# Patient Record
Sex: Female | Born: 1960
Health system: Southern US, Community
[De-identification: ages and names within clinical notes are randomized; demographics above are authoritative.]

## PROBLEM LIST (undated history)

## (undated) DIAGNOSIS — K219 Gastro-esophageal reflux disease without esophagitis: Secondary | ICD-10-CM

---

## 1998-04-05 ENCOUNTER — Other Ambulatory Visit: Admission: RE | Admit: 1998-04-05 | Discharge: 1998-04-05 | Payer: Self-pay | Admitting: Internal Medicine

## 1999-06-13 ENCOUNTER — Encounter: Admission: RE | Admit: 1999-06-13 | Discharge: 1999-06-13 | Payer: Self-pay | Admitting: Obstetrics

## 1999-09-10 ENCOUNTER — Encounter: Admission: RE | Admit: 1999-09-10 | Discharge: 1999-09-10 | Payer: Self-pay | Admitting: Obstetrics & Gynecology

## 1999-09-10 ENCOUNTER — Other Ambulatory Visit: Admission: RE | Admit: 1999-09-10 | Discharge: 1999-09-10 | Payer: Self-pay | Admitting: Obstetrics & Gynecology

## 1999-10-11 ENCOUNTER — Encounter: Admission: RE | Admit: 1999-10-11 | Discharge: 1999-10-11 | Payer: Self-pay | Admitting: Obstetrics & Gynecology

## 2000-04-21 ENCOUNTER — Encounter: Admission: RE | Admit: 2000-04-21 | Discharge: 2000-04-21 | Payer: Self-pay | Admitting: Internal Medicine

## 2000-04-21 ENCOUNTER — Encounter: Payer: Self-pay | Admitting: Internal Medicine

## 2000-04-24 ENCOUNTER — Encounter: Payer: Self-pay | Admitting: Internal Medicine

## 2000-04-24 ENCOUNTER — Encounter: Admission: RE | Admit: 2000-04-24 | Discharge: 2000-04-24 | Payer: Self-pay | Admitting: Internal Medicine

## 2001-08-24 ENCOUNTER — Other Ambulatory Visit: Admission: RE | Admit: 2001-08-24 | Discharge: 2001-08-24 | Payer: Self-pay | Admitting: Internal Medicine

## 2003-02-15 ENCOUNTER — Emergency Department (HOSPITAL_COMMUNITY): Admission: EM | Admit: 2003-02-15 | Discharge: 2003-02-15 | Payer: Self-pay | Admitting: Emergency Medicine

## 2003-02-15 ENCOUNTER — Encounter: Payer: Self-pay | Admitting: Emergency Medicine

## 2003-10-31 ENCOUNTER — Encounter: Admission: RE | Admit: 2003-10-31 | Discharge: 2003-10-31 | Payer: Self-pay | Admitting: Internal Medicine

## 2004-11-13 ENCOUNTER — Encounter: Admission: RE | Admit: 2004-11-13 | Discharge: 2004-11-13 | Payer: Self-pay | Admitting: Internal Medicine

## 2005-12-04 ENCOUNTER — Encounter: Admission: RE | Admit: 2005-12-04 | Discharge: 2005-12-04 | Payer: Self-pay | Admitting: Internal Medicine

## 2007-12-06 ENCOUNTER — Ambulatory Visit: Payer: Self-pay | Admitting: Internal Medicine

## 2007-12-06 DIAGNOSIS — K219 Gastro-esophageal reflux disease without esophagitis: Secondary | ICD-10-CM | POA: Insufficient documentation

## 2007-12-06 DIAGNOSIS — J309 Allergic rhinitis, unspecified: Secondary | ICD-10-CM | POA: Insufficient documentation

## 2008-01-24 ENCOUNTER — Encounter: Admission: RE | Admit: 2008-01-24 | Discharge: 2008-01-24 | Payer: Self-pay | Admitting: Internal Medicine

## 2009-01-29 ENCOUNTER — Encounter: Admission: RE | Admit: 2009-01-29 | Discharge: 2009-01-29 | Payer: Self-pay | Admitting: Internal Medicine

## 2010-01-30 ENCOUNTER — Encounter: Admission: RE | Admit: 2010-01-30 | Discharge: 2010-01-30 | Payer: Self-pay | Admitting: Internal Medicine

## 2011-04-23 ENCOUNTER — Other Ambulatory Visit: Payer: Self-pay | Admitting: Internal Medicine

## 2011-04-23 DIAGNOSIS — Z1231 Encounter for screening mammogram for malignant neoplasm of breast: Secondary | ICD-10-CM

## 2011-06-16 ENCOUNTER — Ambulatory Visit
Admission: RE | Admit: 2011-06-16 | Discharge: 2011-06-16 | Disposition: A | Discharge status: Home / Self Care | Payer: 59 | Source: Ambulatory Visit | Attending: Internal Medicine | Admitting: Internal Medicine

## 2011-06-16 DIAGNOSIS — Z1231 Encounter for screening mammogram for malignant neoplasm of breast: Secondary | ICD-10-CM

## 2011-07-18 ENCOUNTER — Emergency Department (HOSPITAL_COMMUNITY)
Admission: EM | Admit: 2011-07-18 | Discharge: 2011-07-18 | Disposition: A | Discharge status: Home / Self Care | Payer: No Typology Code available for payment source | Attending: Emergency Medicine | Admitting: Emergency Medicine

## 2011-07-18 ENCOUNTER — Encounter: Payer: Self-pay | Admitting: *Deleted

## 2011-07-18 DIAGNOSIS — T1490XA Injury, unspecified, initial encounter: Secondary | ICD-10-CM | POA: Insufficient documentation

## 2011-07-18 DIAGNOSIS — M542 Cervicalgia: Secondary | ICD-10-CM | POA: Insufficient documentation

## 2011-07-18 DIAGNOSIS — R51 Headache: Secondary | ICD-10-CM | POA: Insufficient documentation

## 2011-07-18 HISTORY — DX: Gastro-esophageal reflux disease without esophagitis: K21.9

## 2011-07-18 MED ORDER — IBUPROFEN 800 MG PO TABS: 800.0000 mg | ORAL_TABLET | Freq: Three times a day (TID) | ORAL | Status: AC

## 2011-07-18 MED ORDER — CYCLOBENZAPRINE HCL 10 MG PO TABS: 10.0000 mg | ORAL_TABLET | Freq: Three times a day (TID) | ORAL | Status: AC | PRN

## 2011-07-18 MED ORDER — CYCLOBENZAPRINE HCL 10 MG PO TABS
10.0000 mg | ORAL_TABLET | Freq: Once | ORAL | Status: AC
  Administered 2011-07-18: 10 mg via ORAL
  Filled 2011-07-18: qty 1

## 2011-07-18 MED ORDER — IBUPROFEN 800 MG PO TABS
800.0000 mg | ORAL_TABLET | Freq: Once | ORAL | Status: AC
  Administered 2011-07-18: 800 mg via ORAL
  Filled 2011-07-18: qty 1

## 2011-07-18 NOTE — ED Notes (Signed)
Rx given to pt. 
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
## 2011-07-20 NOTE — ED Provider Notes (Signed)
Medical screening examination/treatment/procedure(s) were performed by non-physician practitioner and as supervising physician I was immediately available for consultation/collaboration.  Cyndra Numbers, MD 07/20/11 [phone]

## 2011-07-24 ENCOUNTER — Ambulatory Visit
Admission: RE | Admit: 2011-07-24 | Discharge: 2011-07-24 | Disposition: A | Discharge status: Home / Self Care | Payer: 59 | Source: Ambulatory Visit | Attending: Family Medicine | Admitting: Family Medicine

## 2011-07-24 ENCOUNTER — Other Ambulatory Visit: Payer: Self-pay | Admitting: Family Medicine

## 2011-07-24 ENCOUNTER — Other Ambulatory Visit (HOSPITAL_COMMUNITY): Payer: Self-pay | Admitting: Family Medicine

## 2011-07-24 DIAGNOSIS — R4781 Slurred speech: Secondary | ICD-10-CM

## 2011-07-24 DIAGNOSIS — R51 Headache: Secondary | ICD-10-CM

## 2011-07-24 DIAGNOSIS — R42 Dizziness and giddiness: Secondary | ICD-10-CM

## 2011-07-25 ENCOUNTER — Other Ambulatory Visit (HOSPITAL_COMMUNITY): Payer: 59

## 2011-07-25 ENCOUNTER — Ambulatory Visit (HOSPITAL_COMMUNITY)
Admission: RE | Admit: 2011-07-25 | Discharge: 2011-07-25 | Disposition: A | Discharge status: Home / Self Care | Payer: No Typology Code available for payment source | Source: Ambulatory Visit | Attending: Family Medicine | Admitting: Family Medicine

## 2011-07-25 DIAGNOSIS — R4789 Other speech disturbances: Secondary | ICD-10-CM | POA: Insufficient documentation

## 2011-07-25 DIAGNOSIS — R42 Dizziness and giddiness: Secondary | ICD-10-CM | POA: Insufficient documentation

## 2011-07-25 DIAGNOSIS — R4781 Slurred speech: Secondary | ICD-10-CM

## 2011-07-25 DIAGNOSIS — R51 Headache: Secondary | ICD-10-CM

## 2012-06-09 ENCOUNTER — Other Ambulatory Visit: Payer: Self-pay | Admitting: Internal Medicine

## 2012-06-09 DIAGNOSIS — Z1231 Encounter for screening mammogram for malignant neoplasm of breast: Secondary | ICD-10-CM

## 2012-07-19 ENCOUNTER — Ambulatory Visit
Admission: RE | Admit: 2012-07-19 | Discharge: 2012-07-19 | Disposition: A | Discharge status: Home / Self Care | Payer: 59 | Source: Ambulatory Visit | Attending: Internal Medicine | Admitting: Internal Medicine

## 2012-07-19 DIAGNOSIS — Z1231 Encounter for screening mammogram for malignant neoplasm of breast: Secondary | ICD-10-CM

## 2012-10-27 ENCOUNTER — Other Ambulatory Visit: Payer: Self-pay | Admitting: Internal Medicine

## 2012-10-27 DIAGNOSIS — R05 Cough: Secondary | ICD-10-CM

## 2012-10-27 DIAGNOSIS — R059 Cough, unspecified: Secondary | ICD-10-CM

## 2012-10-27 MED ORDER — PHENYLEPH-PROMETHAZINE-COD 5-6.25-10 MG/5ML PO SYRP: 5.0000 mL | ORAL_SOLUTION | Freq: Three times a day (TID) | ORAL | Status: DC | PRN

## 2013-05-05 ENCOUNTER — Other Ambulatory Visit: Payer: Self-pay | Admitting: Internal Medicine

## 2013-05-05 DIAGNOSIS — R05 Cough: Secondary | ICD-10-CM

## 2013-05-05 DIAGNOSIS — R059 Cough, unspecified: Secondary | ICD-10-CM

## 2013-05-05 MED ORDER — PHENYLEPH-PROMETHAZINE-COD 5-6.25-10 MG/5ML PO SYRP: 5.0000 mL | ORAL_SOLUTION | Freq: Three times a day (TID) | ORAL | Status: DC | PRN

## 2013-07-04 ENCOUNTER — Other Ambulatory Visit: Payer: Self-pay

## 2013-07-04 DIAGNOSIS — Z1231 Encounter for screening mammogram for malignant neoplasm of breast: Secondary | ICD-10-CM

## 2013-07-19 ENCOUNTER — Other Ambulatory Visit (INDEPENDENT_AMBULATORY_CARE_PROVIDER_SITE_OTHER): Payer: 59

## 2013-07-19 ENCOUNTER — Ambulatory Visit (INDEPENDENT_AMBULATORY_CARE_PROVIDER_SITE_OTHER): Payer: 59 | Admitting: Family Medicine

## 2013-07-19 ENCOUNTER — Encounter: Payer: Self-pay | Admitting: Family Medicine

## 2013-07-19 VITALS — BP 132/86 | HR 80 | Ht 65.0 in

## 2013-07-19 DIAGNOSIS — M25579 Pain in unspecified ankle and joints of unspecified foot: Secondary | ICD-10-CM

## 2013-07-19 DIAGNOSIS — M6749 Ganglion, multiple sites: Secondary | ICD-10-CM

## 2013-07-19 DIAGNOSIS — M25571 Pain in right ankle and joints of right foot: Secondary | ICD-10-CM

## 2013-07-19 DIAGNOSIS — M71371 Other bursal cyst, right ankle and foot: Secondary | ICD-10-CM | POA: Insufficient documentation

## 2013-07-19 MED ORDER — MELOXICAM 15 MG PO TABS: 15.0000 mg | ORAL_TABLET | Freq: Every day | ORAL | Status: DC

## 2013-07-19 MED ORDER — TRAMADOL HCL 50 MG PO TABS: 50.0000 mg | ORAL_TABLET | Freq: Every evening | ORAL | Status: DC | PRN

## 2013-07-19 NOTE — Assessment & Plan Note (Signed)
Patient has what appears to be a ganglion cyst on the lateral aspect. The cyst wall was punctured today but was unfortunately unable to aspirate any fluid. Patient was given injection of 40 mg/dL of the lateral ankle. Patient will do icing protocol, meloxicam daily for 10 days, as well as tramadol as needed. Patient given home exercise program as she is limited daily for the next couple weeks. Patient will followup if she continues to have pain. It patient continues to have pain we need to consider imaging including an x-ray as well as potential MRI to see if this is intra-articular.

## 2013-07-19 NOTE — Progress Notes (Signed)
Pre-visit discussion using our clinic review tool. No additional management support is needed unless otherwise documented below in the visit note.  

## 2013-07-19 NOTE — Progress Notes (Signed)
CC: Right ankle pain  HPI: Patient is a very pleasant 52 year old female coming in with right ankle pain. Patient is nontender any true injury but seems to have an insidious onset of pain on the lateral aspect of ankle. Patient states it seems to hurt more with ambulation. Patient has tried some naproxen, Tylenol, as well as icing with minimal benefit. Patient states she couldn't days when it feels better and then days and feels worse. Patient denies any numbness or tingling or any loss of strength. Patient states that she can of a throbbing sensation at night it does keep her up but does not wake her up at night. Patient is a severity of 6/10.   Past medical, surgical, family and social history reviewed. Medications reviewed all in the electronic medical record.   Review of Systems: No headache, visual changes, nausea, vomiting, diarrhea, constipation, dizziness, abdominal pain, skin rash, fevers, chills, night sweats, weight loss, swollen lymph nodes, body aches, joint swelling, muscle aches, chest pain, shortness of breath, mood changes.   Objective:    Blood pressure 132/86, pulse 80, SpO2 99.00%.   General: No apparent distress alert and oriented x3 mood and affect normal, dressed appropriately.  HEENT: Pupils equal, extraocular movements intact Respiratory: Patient's speak in full sentences and does not appear short of breath Cardiovascular: No lower extremity edema, non tender, no erythema Skin: Warm dry intact with no signs of infection or rash on extremities or on axial skeleton. Abdomen: Soft nontender Neuro: Cranial nerves II through XII are intact, neurovascularly intact in all extremities with 2+ DTRs and 2+ pulses. Lymph: No lymphadenopathy of posterior or anterior cervical chain or axillae bilaterally.  Gait normal with good balance and coordination.  MSK: Non tender with full range of motion and good stability and symmetric strength and tone of shoulders, elbows, wrist, hip,  knee and bilaterally.  Ankle: No visible erythema or swelling. Range of motion is full in all directions. Strength is 5/5 in all directions. Stable lateral and medial ligaments; squeeze test and kleiger test unremarkable; Talar dome nontender; No pain at base of 5th MT; No tenderness over cuboid; No tenderness over N spot or navicular prominence No tenderness on posterior aspects of lateral and medial malleolus No sign of peroneal tendon subluxations or tenderness to palpation Negative tarsal tunnel tinel's Able to walk 4 steps. Patient is tender to palpation over the lateral ankle and does have some fullness. This appears to be cystic in nature just anterior to the lateral aspect of the distal fibula. Contralateral side unremarkable  MSK US performed of: Right ankle This study was ordered, performed, and interpreted by Terrilee Files D.O.  Foot/Ankle:   All structures visualized.   Talar dome unremarkable patient on the anterior lateral aspect has what appears to be a superficial ganglion cyst that measures approximately 2 cm in length and possibly goes fairly deep. Ankle mortise without effusion. Peroneus longus and brevis tendons unremarkable on long and transverse views without sheath effusions. Posterior tibialis, flexor hallucis longus, and flexor digitorum longus tendons unremarkable on long and transverse views without sheath effusions. Achilles tendon visualized along length of tendon and unremarkable on long and transverse views without sheath effusion. Anterior Talofibular Ligament does show degenerative changes and some mild hypoechoic changes.  Deltoid Ligament unremarkable and intact. Plantar fascia intact and without effusion, normal thickness. No increased doppler signal, cap sign, or thickening of tibial cortex. Power doppler signal normal.  IMPRESSION: Ganglion cyst lateral ankle   After verbal and written  consent patient was prepped with alcohol swabs and did have an  injection of the 26-gauge half-inch needle into the lateral anterior ankle under ultrasound guidance. Patient did have injection of 3 cc of 0.5% Marcaine. Patient then had 18-gauge 1-1/2 inch needle inserted into the ganglion cyst that was seen. No aspiration was noted the patient did have sac punctured which did show did go into the soft tissue area. Patient had minimal bleeding. Patient then had an injection of 1 cc of Kenalog 40 mg/dL. Patient was put into compression dressing. Post injection instructions given.  Impression and Recommendations:     This case required medical decision making of moderate complexity.

## 2013-07-19 NOTE — Patient Instructions (Signed)
Ganglion cyst in your ankle.  We did an injection today and it will help Exercises daily starting in 2 days.  You know where I am if you need me.

## 2013-07-29 ENCOUNTER — Ambulatory Visit
Admission: RE | Admit: 2013-07-29 | Discharge: 2013-07-29 | Disposition: A | Discharge status: Home / Self Care | Payer: 59 | Source: Ambulatory Visit

## 2013-07-29 DIAGNOSIS — Z1231 Encounter for screening mammogram for malignant neoplasm of breast: Secondary | ICD-10-CM

## 2013-12-15 ENCOUNTER — Ambulatory Visit (INDEPENDENT_AMBULATORY_CARE_PROVIDER_SITE_OTHER): Payer: 59 | Admitting: Family Medicine

## 2013-12-15 ENCOUNTER — Encounter: Payer: Self-pay | Admitting: Family Medicine

## 2013-12-15 VITALS — BP 122/74 | HR 103

## 2013-12-15 DIAGNOSIS — M7660 Achilles tendinitis, unspecified leg: Secondary | ICD-10-CM

## 2013-12-15 DIAGNOSIS — M6788 Other specified disorders of synovium and tendon, other site: Secondary | ICD-10-CM

## 2013-12-15 NOTE — Patient Instructions (Signed)
Good to see you as always Wear heel lift in shoe New exercises 3 times a week.  Ice 20 minutes at end of day Meloxicam daily for 10 days Come back when you need me.

## 2013-12-15 NOTE — Progress Notes (Signed)
CC: Right ankle pain  HPI: Patient is a very pleasant 52 year old female coming in with right ankle pain . Patient was seen previously and was diagnosed with more of a peroneal assistant was given injection accompanied result patient's tissue. Patient will since then has had more pain on the posterior aspect it feels tight. Patient notices it worse after with sitting for a long amount of time or for steps in the morning. Patient denies though any plantar pain. Patient states it is just difficult to walk secondary to tightness. Denies any swelling or any numbness. States that overall she has been feeling well on the lateral aspect of the foot. States that this is a different pain and rates it 6/10 in severity.   Past medical, surgical, family and social history reviewed. Medications reviewed all in the electronic medical record.   Review of Systems: No headache, visual changes, nausea, vomiting, diarrhea, constipation, dizziness, abdominal pain, skin rash, fevers, chills, night sweats, weight loss, swollen lymph nodes, body aches, joint swelling, muscle aches, chest pain, shortness of breath, mood changes.   Objective:    Blood pressure 122/74, pulse 103, SpO2 99.00%.   General: No apparent distress alert and oriented x3 mood and affect normal, dressed appropriately.  HEENT: Pupils equal, extraocular movements intact Respiratory: Patient's speak in full sentences and does not appear short of breath Cardiovascular: No lower extremity edema, non tender, no erythema Skin: Warm dry intact with no signs of infection or rash on extremities or on axial skeleton. Abdomen: Soft nontender Neuro: Cranial nerves II through XII are intact, neurovascularly intact in all extremities with 2+ DTRs and 2+ pulses. Lymph: No lymphadenopathy of posterior or anterior cervical chain or axillae bilaterally.  Gait normal with good balance and coordination.  MSK: Non tender with full range of motion and good stability  and symmetric strength and tone of shoulders, elbows, wrist, hip, knee and bilaterally.  Ankle: No visible erythema or swelling. Patient does have some hypopigmentation of the skin where it injection was done previously. Range of motion is full in all directions. Strength is 5/5 in all directions. Stable lateral and medial ligaments; squeeze test and kleiger test unremarkable; Talar dome nontender; No pain at base of 5th MT; No tenderness over cuboid; No tenderness over N spot or navicular prominence No tenderness on posterior aspects of lateral and medial malleolus No sign of peroneal tendon subluxations or tenderness to palpation Negative tarsal tunnel tinel's Able to walk 4 steps. Achilles tendon insertion is mildly tender  MSK US performed of: Right ankle This study was ordered, performed, and interpreted by Charlann Boxer D.O.  Foot/Ankle:   All structures visualized.   Talar dome unremarkable patient Ankle mortise without effusion. Peroneus longus and brevis tendons unremarkable on long and transverse views without sheath effusions. Posterior tibialis, flexor hallucis longus, and flexor digitorum longus tendons unremarkable on long and transverse views without sheath effusions. Achilles tendon v does not show any significant hypoechoic changes but the nodule is present.  Anterior Talofibular Ligament does show degenerative changes and some mild hypoechoic changes.  Deltoid Ligament unremarkable and intact. Plantar fascia intact and without effusion, normal thickness. No increased doppler signal, cap sign, or thickening of tibial cortex. Power doppler signal normal.  IMPRESSION: Achilles tendinopathy  .  Impression and Recommendations:     This case required medical decision making of moderate complexity.

## 2013-12-15 NOTE — Assessment & Plan Note (Addendum)
Discussed with patient at great length today. Did go for a new home exercise program, icing protocol, anti-inflammatories as well as heel lifts were given to her today. Patient will try these interventions and come back again in 3-4 weeks for further evaluation.  Spent greater than 25 minutes with patient face-to-face and had greater than 50% of counseling including as described above in assessment and plan.

## 2014-05-31 ENCOUNTER — Other Ambulatory Visit: Payer: Self-pay

## 2014-05-31 DIAGNOSIS — R0789 Other chest pain: Secondary | ICD-10-CM

## 2015-06-19 ENCOUNTER — Other Ambulatory Visit: Payer: Self-pay | Admitting: Family

## 2015-06-19 MED ORDER — PHENAZOPYRIDINE HCL 100 MG PO TABS: 100.0000 mg | ORAL_TABLET | Freq: Three times a day (TID) | ORAL | Status: DC | PRN

## 2015-06-19 MED ORDER — CIPROFLOXACIN HCL 250 MG PO TABS: 250.0000 mg | ORAL_TABLET | Freq: Two times a day (BID) | ORAL | Status: DC

## 2015-06-21 ENCOUNTER — Other Ambulatory Visit: Payer: Self-pay | Admitting: Family

## 2015-06-21 MED ORDER — FLUCONAZOLE 150 MG PO TABS: ORAL_TABLET | ORAL | Status: DC

## 2015-08-23 ENCOUNTER — Other Ambulatory Visit: Payer: Self-pay

## 2015-08-23 DIAGNOSIS — Z1231 Encounter for screening mammogram for malignant neoplasm of breast: Secondary | ICD-10-CM

## 2015-09-07 ENCOUNTER — Ambulatory Visit
Admission: RE | Admit: 2015-09-07 | Discharge: 2015-09-07 | Disposition: A | Discharge status: Home / Self Care | Payer: 59 | Source: Ambulatory Visit

## 2015-09-07 DIAGNOSIS — Z0000 Encounter for general adult medical examination without abnormal findings: Secondary | ICD-10-CM | POA: Diagnosis not present

## 2015-09-07 DIAGNOSIS — E782 Mixed hyperlipidemia: Secondary | ICD-10-CM | POA: Diagnosis not present

## 2015-09-07 DIAGNOSIS — Z1231 Encounter for screening mammogram for malignant neoplasm of breast: Secondary | ICD-10-CM

## 2015-09-07 DIAGNOSIS — Z1211 Encounter for screening for malignant neoplasm of colon: Secondary | ICD-10-CM | POA: Diagnosis not present

## 2015-09-07 DIAGNOSIS — Z1239 Encounter for other screening for malignant neoplasm of breast: Secondary | ICD-10-CM | POA: Diagnosis not present

## 2015-09-07 MED FILL — METOPROLOL TARTRATE 50 MG T: 50 | 90 days supply | Qty: 90 | Fill #0

## 2015-09-07 MED FILL — CYCLOBENZAPRINE 10 MG TAB: 10 | 15 days supply | Qty: 30 | Fill #0

## 2015-09-12 MED FILL — PANTOPRAZOLE SOD DR 40 MG T: 40 | 90 days supply | Qty: 90 | Fill #0

## 2015-10-30 ENCOUNTER — Other Ambulatory Visit: Payer: Self-pay | Admitting: Family

## 2015-10-30 DIAGNOSIS — R05 Cough: Secondary | ICD-10-CM

## 2015-10-30 DIAGNOSIS — R059 Cough, unspecified: Secondary | ICD-10-CM

## 2015-10-30 MED ORDER — PHENYLEPH-PROMETHAZINE-COD 5-6.25-10 MG/5ML PO SYRP: 5.0000 mL | ORAL_SOLUTION | Freq: Three times a day (TID) | ORAL | Status: DC | PRN

## 2015-10-30 MED FILL — PROMETHAZINE-PE-CODEINE SYR: 6.25-5-10 | 16 days supply | Qty: 240 | Fill #0

## 2015-12-10 ENCOUNTER — Other Ambulatory Visit: Payer: Self-pay | Admitting: Family

## 2015-12-10 MED ORDER — CEFDINIR 300 MG PO CAPS: 300.0000 mg | ORAL_CAPSULE | Freq: Two times a day (BID) | ORAL | Status: DC

## 2015-12-10 MED FILL — CEFDINIR 300 MG CAPSULE: 300 | 7 days supply | Qty: 14 | Fill #0

## 2016-01-28 ENCOUNTER — Other Ambulatory Visit: Payer: Self-pay

## 2016-01-28 ENCOUNTER — Other Ambulatory Visit: Payer: Self-pay | Admitting: Family

## 2016-01-28 MED ORDER — PREDNISONE 10 MG (21) PO TBPK: ORAL_TABLET | ORAL | Status: DC

## 2016-01-28 MED FILL — predniSONE 10 MG (21) TBPK: 10 | 6 days supply | Qty: 21 | Fill #0

## 2016-03-10 DIAGNOSIS — R5383 Other fatigue: Secondary | ICD-10-CM | POA: Diagnosis not present

## 2016-03-10 DIAGNOSIS — E782 Mixed hyperlipidemia: Secondary | ICD-10-CM | POA: Diagnosis not present

## 2016-03-10 DIAGNOSIS — Z0100 Encounter for examination of eyes and vision without abnormal findings: Secondary | ICD-10-CM | POA: Diagnosis not present

## 2016-03-10 DIAGNOSIS — E559 Vitamin D deficiency, unspecified: Secondary | ICD-10-CM | POA: Diagnosis not present

## 2016-04-28 DIAGNOSIS — S93602A Unspecified sprain of left foot, initial encounter: Secondary | ICD-10-CM | POA: Diagnosis not present

## 2016-05-01 ENCOUNTER — Other Ambulatory Visit: Payer: Self-pay | Admitting: Family

## 2016-05-01 MED ORDER — CIPROFLOXACIN HCL 500 MG PO TABS: 500.0000 mg | ORAL_TABLET | Freq: Two times a day (BID) | ORAL | 0 refills | Status: DC

## 2016-05-01 MED FILL — CIPROFLOXACIN HCL 500 MG TA: 500 | 3 days supply | Qty: 6 | Fill #0

## 2016-06-20 DIAGNOSIS — H0014 Chalazion left upper eyelid: Secondary | ICD-10-CM | POA: Diagnosis not present

## 2016-06-24 MED FILL — PANTOPRAZOLE SOD DR 40 MG T: 40 | 90 days supply | Qty: 90 | Fill #0

## 2016-06-24 MED FILL — CYCLOBENZAPRINE 10 MG TAB: 10 | 15 days supply | Qty: 30 | Fill #1

## 2016-06-24 MED FILL — METOPROLOL TARTRATE 50 MG T: 50 | 90 days supply | Qty: 90 | Fill #1

## 2016-07-28 ENCOUNTER — Other Ambulatory Visit: Payer: Self-pay | Admitting: *Deleted

## 2016-07-28 MED ORDER — ALLOPURINOL 100 MG PO TABS: 100.0000 mg | ORAL_TABLET | Freq: Every day | ORAL | 1 refills | Status: DC

## 2016-07-28 MED FILL — ALLOPURINOL 100 MG TABLET: 100 | 90 days supply | Qty: 90 | Fill #0

## 2016-07-28 NOTE — Telephone Encounter (Signed)
Per dr Tamala Julian, okay to send in allopurinol 100mg  qd.

## 2016-07-29 ENCOUNTER — Other Ambulatory Visit: Payer: Self-pay | Admitting: Nurse Practitioner

## 2016-07-29 DIAGNOSIS — Z1231 Encounter for screening mammogram for malignant neoplasm of breast: Secondary | ICD-10-CM

## 2016-09-08 ENCOUNTER — Ambulatory Visit
Admission: RE | Admit: 2016-09-08 | Discharge: 2016-09-08 | Disposition: A | Discharge status: Home / Self Care | Payer: 59 | Source: Ambulatory Visit | Attending: Nurse Practitioner | Admitting: Nurse Practitioner

## 2016-09-08 DIAGNOSIS — Z0000 Encounter for general adult medical examination without abnormal findings: Secondary | ICD-10-CM | POA: Diagnosis not present

## 2016-09-08 DIAGNOSIS — Z1211 Encounter for screening for malignant neoplasm of colon: Secondary | ICD-10-CM | POA: Diagnosis not present

## 2016-09-08 DIAGNOSIS — Z1231 Encounter for screening mammogram for malignant neoplasm of breast: Secondary | ICD-10-CM

## 2016-09-08 DIAGNOSIS — Z1239 Encounter for other screening for malignant neoplasm of breast: Secondary | ICD-10-CM | POA: Diagnosis not present

## 2016-09-08 DIAGNOSIS — E782 Mixed hyperlipidemia: Secondary | ICD-10-CM | POA: Diagnosis not present

## 2016-09-08 DIAGNOSIS — R5383 Other fatigue: Secondary | ICD-10-CM | POA: Diagnosis not present

## 2016-09-08 DIAGNOSIS — R002 Palpitations: Secondary | ICD-10-CM | POA: Diagnosis not present

## 2016-09-08 DIAGNOSIS — N951 Menopausal and female climacteric states: Secondary | ICD-10-CM | POA: Diagnosis not present

## 2016-09-08 DIAGNOSIS — E559 Vitamin D deficiency, unspecified: Secondary | ICD-10-CM | POA: Diagnosis not present

## 2016-09-08 MED FILL — PARoxetine HCL 10 MG TABS: 10 | 30 days supply | Qty: 30 | Fill #0

## 2016-09-29 ENCOUNTER — Other Ambulatory Visit: Payer: Self-pay

## 2016-09-29 DIAGNOSIS — M2550 Pain in unspecified joint: Secondary | ICD-10-CM

## 2016-10-08 MED FILL — PANTOPRAZOLE SOD DR 40 MG T: 40 | 90 days supply | Qty: 90 | Fill #1

## 2016-10-08 MED FILL — METOPROLOL TARTRATE 50 MG T: 50 | 90 days supply | Qty: 90 | Fill #0

## 2016-10-29 MED FILL — OLOPATADINE HCL 0.1% EYE DR: 0.1 | 25 days supply | Qty: 5 | Fill #0

## 2016-11-05 ENCOUNTER — Other Ambulatory Visit: Payer: Self-pay

## 2016-11-05 ENCOUNTER — Other Ambulatory Visit: Payer: 59

## 2016-11-05 DIAGNOSIS — G8929 Other chronic pain: Secondary | ICD-10-CM

## 2016-11-05 DIAGNOSIS — M25569 Pain in unspecified knee: Principal | ICD-10-CM

## 2016-11-06 ENCOUNTER — Other Ambulatory Visit: Payer: Self-pay | Admitting: Family Medicine

## 2016-11-06 LAB — SYNOVIAL CELL COUNT + DIFF, W/ CRYSTALS
Basophils, %: 0 %
Eosinophils-Synovial: 0 % (ref 0–2)
Lymphocytes-Synovial Fld: 93 % — ABNORMAL HIGH (ref 0–74)
MONOCYTE/MACROPHAGE: 6 % (ref 0–69)
Neutrophil, Synovial: 1 % (ref 0–24)
SYNOVIOCYTES, %: 0 % (ref 0–15)
WBC, Synovial: 65 cells/uL (ref <150)

## 2016-11-06 MED ORDER — ALLOPURINOL 300 MG PO TABS: 300.0000 mg | ORAL_TABLET | Freq: Every day | ORAL | 1 refills | Status: DC

## 2016-11-06 MED FILL — ALLOPURINOL 300 MG TAB: 300 | 90 days supply | Qty: 90 | Fill #0

## 2016-12-30 ENCOUNTER — Other Ambulatory Visit: Payer: Self-pay

## 2016-12-30 MED ORDER — PREDNISONE 50 MG PO TABS: 20.0000 mg | ORAL_TABLET | Freq: Every day | ORAL | 0 refills | Status: DC

## 2016-12-30 MED ORDER — PREDNISONE 50 MG PO TABS: 50.0000 mg | ORAL_TABLET | Freq: Every day | ORAL | 0 refills | Status: DC

## 2016-12-30 MED FILL — predniSONE 50 MG TABS: 50 | 5 days supply | Qty: 5 | Fill #0

## 2017-01-28 ENCOUNTER — Other Ambulatory Visit: Payer: Self-pay | Admitting: Family

## 2017-01-28 MED ORDER — FEBUXOSTAT 40 MG PO TABS: 40.0000 mg | ORAL_TABLET | Freq: Every day | ORAL | 2 refills | Status: DC

## 2017-01-28 MED ORDER — IBUPROFEN-FAMOTIDINE 800-26.6 MG PO TABS: 1.0000 | ORAL_TABLET | Freq: Three times a day (TID) | ORAL | 1 refills | Status: DC | PRN

## 2017-02-27 MED FILL — ALLOPURINOL 300 MG TAB: 300 | 90 days supply | Qty: 90 | Fill #1

## 2017-02-27 MED FILL — METOPROLOL TARTRATE 50 MG T: 50 | 90 days supply | Qty: 90 | Fill #1

## 2017-02-27 MED FILL — PANTOPRAZOLE SOD DR 40 MG T: 40 | 90 days supply | Qty: 90 | Fill #2

## 2017-03-12 ENCOUNTER — Other Ambulatory Visit: Payer: Self-pay | Admitting: Family

## 2017-03-12 MED ORDER — PROMETHAZINE-PHENYLEPH-CODEINE 6.25-5-10 MG/5ML PO SYRP: 5.0000 mL | ORAL_SOLUTION | Freq: Three times a day (TID) | ORAL | 1 refills | Status: DC | PRN

## 2017-03-12 MED FILL — PROMETHAZINE VC-CODEINE SYR: 6.25-5-10 | 10 days supply | Qty: 180 | Fill #0

## 2017-03-27 DIAGNOSIS — H52203 Unspecified astigmatism, bilateral: Secondary | ICD-10-CM | POA: Diagnosis not present

## 2017-04-08 ENCOUNTER — Other Ambulatory Visit: Payer: Self-pay | Admitting: Family

## 2017-04-08 MED ORDER — COLCHICINE 0.6 MG PO TABS: ORAL_TABLET | ORAL | 0 refills | Status: DC

## 2017-04-08 MED FILL — COLCHICINE 0.6 MG TABLET: 0.6 | 4 days supply | Qty: 10 | Fill #0

## 2017-06-11 DIAGNOSIS — Z1211 Encounter for screening for malignant neoplasm of colon: Secondary | ICD-10-CM | POA: Diagnosis not present

## 2017-06-11 DIAGNOSIS — Z800 Family history of malignant neoplasm of digestive organs: Secondary | ICD-10-CM | POA: Diagnosis not present

## 2017-07-06 ENCOUNTER — Other Ambulatory Visit: Payer: Self-pay

## 2017-07-06 MED ORDER — ALLOPURINOL 300 MG PO TABS: 300.0000 mg | ORAL_TABLET | Freq: Every day | ORAL | 1 refills | Status: DC

## 2017-07-06 MED FILL — ALLOPURINOL 300 MG TAB: 300 | 90 days supply | Qty: 90 | Fill #0

## 2017-07-07 MED FILL — PANTOPRAZOLE SOD DR 40 MG T: 40 | 90 days supply | Qty: 90 | Fill #0

## 2017-07-07 MED FILL — METOPROLOL TARTRATE 50 MG T: 50 | 90 days supply | Qty: 90 | Fill #2

## 2017-08-05 MED FILL — PEG-3350 AND ELECTROLYTES S: 236 | 1 days supply | Qty: 4000 | Fill #0

## 2017-08-14 DIAGNOSIS — Z1211 Encounter for screening for malignant neoplasm of colon: Secondary | ICD-10-CM | POA: Diagnosis not present

## 2017-08-14 DIAGNOSIS — D122 Benign neoplasm of ascending colon: Secondary | ICD-10-CM | POA: Diagnosis not present

## 2017-08-14 DIAGNOSIS — Z800 Family history of malignant neoplasm of digestive organs: Secondary | ICD-10-CM | POA: Diagnosis not present

## 2017-08-14 DIAGNOSIS — K635 Polyp of colon: Secondary | ICD-10-CM | POA: Diagnosis not present

## 2017-08-14 DIAGNOSIS — D124 Benign neoplasm of descending colon: Secondary | ICD-10-CM | POA: Diagnosis not present

## 2017-08-19 ENCOUNTER — Other Ambulatory Visit: Payer: Self-pay | Admitting: Nurse Practitioner

## 2017-08-19 DIAGNOSIS — Z139 Encounter for screening, unspecified: Secondary | ICD-10-CM

## 2017-09-10 MED FILL — POLYMYXIN B/TMP EYE DROPS: 10000-0.1 | 50 days supply | Qty: 10 | Fill #0

## 2017-09-14 ENCOUNTER — Ambulatory Visit
Admission: RE | Admit: 2017-09-14 | Discharge: 2017-09-14 | Disposition: A | Discharge status: Home / Self Care | Payer: 59 | Source: Ambulatory Visit | Attending: Nurse Practitioner | Admitting: Nurse Practitioner

## 2017-09-14 DIAGNOSIS — E559 Vitamin D deficiency, unspecified: Secondary | ICD-10-CM | POA: Diagnosis not present

## 2017-09-14 DIAGNOSIS — Z1211 Encounter for screening for malignant neoplasm of colon: Secondary | ICD-10-CM | POA: Diagnosis not present

## 2017-09-14 DIAGNOSIS — J22 Unspecified acute lower respiratory infection: Secondary | ICD-10-CM | POA: Diagnosis not present

## 2017-09-14 DIAGNOSIS — R5383 Other fatigue: Secondary | ICD-10-CM | POA: Diagnosis not present

## 2017-09-14 DIAGNOSIS — Z1231 Encounter for screening mammogram for malignant neoplasm of breast: Secondary | ICD-10-CM | POA: Diagnosis not present

## 2017-09-14 DIAGNOSIS — Z0000 Encounter for general adult medical examination without abnormal findings: Secondary | ICD-10-CM | POA: Diagnosis not present

## 2017-09-14 DIAGNOSIS — Z139 Encounter for screening, unspecified: Secondary | ICD-10-CM

## 2017-09-14 DIAGNOSIS — E782 Mixed hyperlipidemia: Secondary | ICD-10-CM | POA: Diagnosis not present

## 2017-09-14 DIAGNOSIS — Z1239 Encounter for other screening for malignant neoplasm of breast: Secondary | ICD-10-CM | POA: Diagnosis not present

## 2017-09-14 MED FILL — AMOXICILLIN 875 MG TABLET: 875 | 10 days supply | Qty: 20 | Fill #0

## 2017-09-14 MED FILL — HYDROCODONE-HOMATROPINE SYR: 5-1.5 | 6 days supply | Qty: 120 | Fill #0

## 2017-10-19 MED FILL — OLOPATADINE HCL 0.1% EYE DR: 0.1 | 25 days supply | Qty: 5 | Fill #1

## 2017-10-20 MED FILL — POLYMYXIN B/TMP EYE DROPS: 10000-0.1 | 25 days supply | Qty: 10 | Fill #1

## 2017-10-29 MED FILL — ALLOPURINOL 300 MG TAB: 300 | 90 days supply | Qty: 90 | Fill #1

## 2017-12-18 ENCOUNTER — Ambulatory Visit (INDEPENDENT_AMBULATORY_CARE_PROVIDER_SITE_OTHER): Payer: 59 | Admitting: Family Medicine

## 2017-12-18 ENCOUNTER — Other Ambulatory Visit: Payer: 59

## 2017-12-18 DIAGNOSIS — G8929 Other chronic pain: Secondary | ICD-10-CM

## 2017-12-18 DIAGNOSIS — M25462 Effusion, left knee: Secondary | ICD-10-CM | POA: Diagnosis not present

## 2017-12-18 DIAGNOSIS — M25562 Pain in left knee: Secondary | ICD-10-CM | POA: Diagnosis not present

## 2017-12-18 NOTE — Assessment & Plan Note (Signed)
Injected today.  Tolerated the procedure well.  Discussed icing regimen and home exercises.  Discussed which activities to do which wants to avoid.  Patient will increase activity as tolerated.  Follow-up with me again in 4 to 6 weeks

## 2017-12-18 NOTE — Progress Notes (Signed)
[facility] [address] [place], [place] [address] Phone: [phone] Subjective:    CC: left knee pain   [id number]  Jade [facility] is a 57 y.o. female coming in with complaint of left knee pain.   Patient has had difficulty with swelling of his knee previously.  Complains of gout.  Having increasing discomfort in the knee again.  Describes it as a dull, throbbing aching sensation.  Sometimes waking her up at night.  Affecting daily activities such as walking.  No new injury.     Past Medical History:  Diagnosis Date  . Acid reflux    No past surgical history on file. Social History   Socioeconomic History  . Marital status: Single    Spouse name: Not on file  . Number of children: Not on file  . Years of education: Not on file  . Highest education level: Not on file  Occupational History  . Not on file  Social Needs  . Financial resource strain: Not on file  . Food insecurity:    Worry: Not on file    Inability: Not on file  . Transportation needs:    Medical: Not on file    Non-medical: Not on file  Tobacco Use  . Smoking status: Never Smoker  Substance and Sexual Activity  . Alcohol use: No  . Drug use: No  . Sexual activity: Yes    Birth control/protection: IUD  Lifestyle  . Physical activity:    Days per week: Not on file    Minutes per session: Not on file  . Stress: Not on file  Relationships  . Social connections:    Talks on phone: Not on file    Gets together: Not on file    Attends religious service: Not on file    Active member of club or organization: Not on file    Attends meetings of clubs or organizations: Not on file    Relationship status: Not on file  Other Topics Concern  . Not on file  Social History Narrative  . Not on file   No Known Allergies No family history on file.   Past medical history, social, surgical and family history all reviewed in electronic medical record.  No pertanent  information unless stated regarding to the chief complaint.   Review of Systems:Review of systems updated and as accurate as of 12/18/17  No headache, visual changes, nausea, vomiting, diarrhea, constipation, dizziness, abdominal pain, skin rash, fevers, chills, night sweats, weight loss, swollen lymph nodes, body aches, joint swelling, muscle aches, chest pain, shortness of breath, mood changes.   Objective Systems examined below as of 12/18/17   General: No apparent distress alert and oriented x3 mood and affect normal, dressed appropriately.  HEENT: Pupils equal, extraocular movements intact  Respiratory: Patient's speak in full sentences and does not appear short of breath  Cardiovascular: No lower extremity edema, non tender, no erythema  Skin: Warm dry intact with no signs of infection or rash on extremities or on axial skeleton.  Abdomen: Soft nontender  Neuro: Cranial nerves II through XII are intact, neurovascularly intact in all extremities with 2+ DTRs and 2+ pulses.  Lymph: No lymphadenopathy of posterior or anterior cervical chain or axillae bilaterally.  Gait mild antalgic MSK:  Non tender with full range of motion and good stability and symmetric strength and tone of shoulders, elbows, wrist, hip, and ankles bilaterally.   Left knee exam shows some tenderness to  palpation over the medial and lateral joint line.  Trace swelling of the patellofemoral joint noted. Decrease ROM by 5 degrees.   After informed written and verbal consent, patient was seated on exam table. Left knee was prepped with alcohol swab and utilizing anterolateral approach, patient's left knee space was injected with 4:1  marcaine 0.5%: Kenalog 40mg /dL. Patient tolerated the procedure well without immediate complications.    Impression and Recommendations:     This case required medical decision making of moderate complexity.      Note: This dictation was prepared with Dragon dictation along with  smaller phrase technology. Any transcriptional errors that result from this process are unintentional.

## 2017-12-19 LAB — SYNOVIAL CELL COUNT + DIFF, W/ CRYSTALS
Basophils, %: 0 %
Eosinophils-Synovial: 0 % (ref 0–2)
Lymphocytes-Synovial Fld: 43 % (ref 0–74)
Monocyte/Macrophage: 17 % (ref 0–69)
Neutrophil, Synovial: 40 % — ABNORMAL HIGH (ref 0–24)
Synoviocytes, %: 0 % (ref 0–15)
WBC, SYNOVIAL: 899 {cells}/uL — AB (ref <150)

## 2017-12-21 ENCOUNTER — Telehealth: Payer: Self-pay | Admitting: *Deleted

## 2017-12-21 MED ORDER — DOXYCYCLINE HYCLATE 100 MG PO TABS: 100.0000 mg | ORAL_TABLET | Freq: Two times a day (BID) | ORAL | 0 refills | Status: DC

## 2017-12-21 MED FILL — DOXYCYCLINE HYCLATE 100 MG: 100 | 10 days supply | Qty: 20 | Fill #0

## 2017-12-21 NOTE — Telephone Encounter (Signed)
Discussed with pt. Sent in doxy 100mg  #20.

## 2017-12-21 NOTE — Telephone Encounter (Signed)
-----   Message from Lyndal Pulley, DO sent at 12/19/2017 12:24 PM EDT ----- Marykay Lex likely got but could be early infection. Can you talk to her make sure she is taking her gout med and if she wants to be safe then let's do doxy 2 times a day for 10 days please

## 2018-02-09 ENCOUNTER — Other Ambulatory Visit: Payer: Self-pay | Admitting: *Deleted

## 2018-02-09 MED ORDER — ALLOPURINOL 300 MG PO TABS: 300.0000 mg | ORAL_TABLET | Freq: Every day | ORAL | 1 refills | Status: DC

## 2018-02-09 MED FILL — ALLOPURINOL 300 MG TABS: 300 | 90 days supply | Qty: 90 | Fill #0

## 2018-02-09 MED FILL — PANTOPRAZOLE SOD DR 40 MG T: 40 | 90 days supply | Qty: 90 | Fill #0

## 2018-04-05 DIAGNOSIS — H52203 Unspecified astigmatism, bilateral: Secondary | ICD-10-CM | POA: Diagnosis not present

## 2018-04-20 ENCOUNTER — Other Ambulatory Visit: Payer: Self-pay | Admitting: Family

## 2018-04-20 MED ORDER — SULFAMETHOXAZOLE-TRIMETHOPRIM 800-160 MG PO TABS: 1.0000 | ORAL_TABLET | Freq: Two times a day (BID) | ORAL | 0 refills | Status: DC

## 2018-04-20 MED FILL — SULFAMETHOXAZOLE-TMP DS TAB: 800-160 | 5 days supply | Qty: 10 | Fill #0

## 2018-04-20 NOTE — Progress Notes (Signed)
Treated patient for UTI as professional courtesy; saw NP with complaints of urinary burning/ frequency; unable to see her PCP today; follow-up if symptoms persist.

## 2018-05-20 ENCOUNTER — Other Ambulatory Visit: Payer: Self-pay | Admitting: Nurse Practitioner

## 2018-05-20 MED FILL — ALLOPURINOL 300 MG TAB: 300 | 90 days supply | Qty: 90 | Fill #1

## 2018-05-20 MED FILL — PANTOPRAZOLE SOD DR 40 MG T: 40 | 90 days supply | Qty: 90 | Fill #1

## 2018-08-23 ENCOUNTER — Other Ambulatory Visit: Payer: Self-pay | Admitting: Nurse Practitioner

## 2018-08-23 ENCOUNTER — Other Ambulatory Visit: Payer: Self-pay | Admitting: Family Medicine

## 2018-08-23 MED FILL — ALLOPURINOL 300 MG TABS: 300 | 90 days supply | Qty: 90 | Fill #0

## 2018-08-25 MED FILL — PANTOPRAZOLE SOD DR 40 MG T: 40 | 90 days supply | Qty: 90 | Fill #0

## 2018-09-09 ENCOUNTER — Other Ambulatory Visit: Payer: Self-pay | Admitting: Nurse Practitioner

## 2018-09-09 DIAGNOSIS — Z1231 Encounter for screening mammogram for malignant neoplasm of breast: Secondary | ICD-10-CM

## 2018-09-20 ENCOUNTER — Ambulatory Visit (INDEPENDENT_AMBULATORY_CARE_PROVIDER_SITE_OTHER): Payer: 59 | Admitting: Nurse Practitioner

## 2018-09-20 ENCOUNTER — Ambulatory Visit
Admission: RE | Admit: 2018-09-20 | Discharge: 2018-09-20 | Disposition: A | Discharge status: Home / Self Care | Payer: 59 | Source: Ambulatory Visit | Attending: Nurse Practitioner | Admitting: Nurse Practitioner

## 2018-09-20 ENCOUNTER — Encounter: Payer: Self-pay | Admitting: Nurse Practitioner

## 2018-09-20 VITALS — BP 122/84 | HR 66 | Temp 97.9°F | Ht 62.4 in | Wt 155.8 lb

## 2018-09-20 DIAGNOSIS — Z139 Encounter for screening, unspecified: Secondary | ICD-10-CM

## 2018-09-20 DIAGNOSIS — E7800 Pure hypercholesterolemia, unspecified: Secondary | ICD-10-CM

## 2018-09-20 DIAGNOSIS — R7309 Other abnormal glucose: Secondary | ICD-10-CM | POA: Diagnosis not present

## 2018-09-20 DIAGNOSIS — K219 Gastro-esophageal reflux disease without esophagitis: Secondary | ICD-10-CM

## 2018-09-20 DIAGNOSIS — Z0000 Encounter for general adult medical examination without abnormal findings: Secondary | ICD-10-CM

## 2018-09-20 DIAGNOSIS — E785 Hyperlipidemia, unspecified: Secondary | ICD-10-CM

## 2018-09-20 DIAGNOSIS — R002 Palpitations: Secondary | ICD-10-CM

## 2018-09-20 DIAGNOSIS — E559 Vitamin D deficiency, unspecified: Secondary | ICD-10-CM | POA: Diagnosis not present

## 2018-09-20 DIAGNOSIS — Z1231 Encounter for screening mammogram for malignant neoplasm of breast: Secondary | ICD-10-CM

## 2018-09-20 DIAGNOSIS — M1A4620 Other secondary chronic gout, left knee, without tophus (tophi): Secondary | ICD-10-CM

## 2018-09-20 DIAGNOSIS — M109 Gout, unspecified: Secondary | ICD-10-CM | POA: Insufficient documentation

## 2018-09-20 HISTORY — DX: Pure hypercholesterolemia, unspecified: E78.00

## 2018-09-20 LAB — POCT URINALYSIS DIPSTICK
Blood, UA: NEGATIVE
Glucose, UA: NEGATIVE
Ketones, UA: NEGATIVE
Nitrite, UA: NEGATIVE
Protein, UA: POSITIVE — AB
Spec Grav, UA: 1.025 (ref 1.010–1.025)
Urobilinogen, UA: 1 E.U./dL
pH, UA: 6 (ref 5.0–8.0)

## 2018-09-20 MED ORDER — METOPROLOL TARTRATE 50 MG PO TABS: 50.0000 mg | ORAL_TABLET | Freq: Every day | ORAL | 1 refills | Status: DC

## 2018-09-20 MED FILL — METOPROLOL TARTRATE 50 MG T: 50 | 90 days supply | Qty: 90 | Fill #0

## 2018-09-20 NOTE — Progress Notes (Signed)
POC

## 2018-09-20 NOTE — Progress Notes (Signed)
Subjective:     Patient ID: Jade Owen , female    DOB: [birth year] , 58 y.o.   MRN: [id number]   Chief Complaint  Patient presents with  . Annual Exam   HPI   Here for HM  Gout - she is taking colchicine as needed.  When she eats red meat she has a flare.  Has had a few flares since her last visit.  Left knee.    Gastroesophageal Reflux  She reports no chest pain or no coughing.   The patient states she uses IUD for birth control. Last LMP was No LMP recorded. (Menstrual status: IUD).. Negative for Dysmenorrhea and Negative for Menorrhagia Mammogram last done 09/20/2018. Negative for: breast discharge, breast lump(s), breast pain and breast self exam.  Pertinent negatives include abnormal bleeding (hematology), anxiety, decreased libido, depression, difficulty falling sleep, dyspareunia, history of infertility, nocturia, sexual dysfunction, sleep disturbances, urinary incontinence, urinary urgency, vaginal discharge and vaginal itching. Diet regular.The patient states her exercise level is  none.  She does walk a lot with work.      The patient's tobacco use is:  Social History   Tobacco Use  Smoking Status Never Smoker  Smokeless Tobacco Never Used  . She has been exposed to passive smoke. The patient's alcohol use is:  Social History   Substance and Sexual Activity  Alcohol Use No   Additional information: [facility] pap 2018 , next one scheduled for 2020 - has them yearly.    Past Medical History:  Diagnosis Date  . Acid reflux      Family History  Problem Relation Age of Onset  . Breast cancer Neg Hx      Current Outpatient Medications:  .  allopurinol (ZYLOPRIM) 300 MG tablet, TAKE 1 TABLET BY MOUTH ONCE DAILY, Disp: 90 tablet, Rfl: 1 .  colchicine 0.6 MG tablet, Take 2 tablets by mouth at the onset of a flare and 1 tablet an hour later., Disp: 10 tablet, Rfl: 0 .  cyclobenzaprine (FLEXERIL) 5 MG tablet, Take 5 mg by mouth as needed for muscle  spasms., Disp: , Rfl:  .  esomeprazole (NEXIUM) 40 MG capsule, Take 40 mg by mouth daily before breakfast.  , Disp: , Rfl:  .  febuxostat (ULORIC) 40 MG tablet, Take 1 tablet (40 mg total) by mouth daily., Disp: 30 tablet, Rfl: 2 .  pantoprazole (PROTONIX) 40 MG tablet, TAKE 1 TABLET BY MOUTH ONCE DAILY, Disp: 90 tablet, Rfl: 1   No Known Allergies   Review of Systems  Constitutional: Negative.   HENT: Negative.   Eyes: Negative.   Respiratory: Negative.  Negative for cough.   Cardiovascular: Negative.  Negative for chest pain, palpitations and leg swelling.  Gastrointestinal: Negative.   Endocrine: Negative.   Genitourinary: Negative.   Musculoskeletal: Negative.   Skin: Negative.   Allergic/Immunologic: Negative.   Neurological: Negative.  Negative for dizziness and headaches.  Hematological: Negative.   Psychiatric/Behavioral: Negative.      Today's Vitals   09/20/18 0852  BP: 122/84  Pulse: 66  Temp: 97.9 F (36.6 C)  TempSrc: Oral  SpO2: 98%  Weight: 155 lb 12.8 oz (70.7 kg)  Height: 5' 2.4" (1.585 m)  PainSc: 0-No pain   Body mass index is 28.13 kg/m.   Objective:  Physical Exam Constitutional:      General: She is not in acute distress.    Appearance: Normal appearance. She is well-developed.  HENT:     Head: Normocephalic  and atraumatic.     Right Ear: Hearing, tympanic membrane, ear canal and external ear normal.     Left Ear: Hearing, tympanic membrane, ear canal and external ear normal.     Nose: Nose normal.     Mouth/Throat:     Mouth: Mucous membranes are moist.  Eyes:     General: Lids are normal.     Extraocular Movements: Extraocular movements intact.     Conjunctiva/sclera: Conjunctivae normal.     Pupils: Pupils are equal, round, and reactive to light.     Funduscopic exam:    Right eye: No papilledema.        Left eye: No papilledema.  Neck:     Musculoskeletal: Full passive range of motion without pain, normal range of motion and neck  supple.     Thyroid: No thyroid mass.     Vascular: No carotid bruit.  Cardiovascular:     Rate and Rhythm: Normal rate and regular rhythm.     Pulses: Normal pulses.     Heart sounds: Normal heart sounds. No murmur.  Pulmonary:     Effort: Pulmonary effort is normal.     Breath sounds: Normal breath sounds.  Abdominal:     General: Abdomen is flat. Bowel sounds are normal.     Palpations: Abdomen is soft.  Musculoskeletal: Normal range of motion.        General: No swelling.     Right lower leg: No edema.     Left lower leg: No edema.  Skin:    General: Skin is warm and dry.     Capillary Refill: Capillary refill takes less than 2 seconds.  Neurological:     General: No focal deficit present.     Mental Status: She is alert and oriented to person, place, and time.     Cranial Nerves: No cranial nerve deficit.     Sensory: No sensory deficit.  Psychiatric:        Mood and Affect: Mood normal.        Behavior: Behavior normal.        Thought Content: Thought content normal.        Judgment: Judgment normal.         Assessment And Plan:    1. Encounter for general adult medical examination w/o abnormal findings . Behavior modifications discussed and diet history reviewed.   . Pt will continue to exercise regularly and modify diet with low GI, plant based foods and decrease intake of processed foods.  . Recommend intake of daily multivitamin, Vitamin D, and calcium.  . Recommend mammogram (today) and colonoscopy (up to date due in 5 years) for preventive screenings, as well as recommend immunizations that include influenza, TDAP (she will get a copy to provide [place]) - POCT Urinalysis Dipstick (81002) - CBC no Diff - CMP14 + Anion Gap  2. Encounter for screening  Will check for Hepatitis C screening due to being born between the years 1945-1965 - Hepatitis C antibody - HIV antibody (with reflex)  3. Palpitations  No recent episodes  Continue with metoprolol daily -  metoprolol tartrate (LOPRESSOR) 50 MG tablet; Take 1 tablet (50 mg total) by mouth daily.  Dispense: 90 tablet; Refill: 1  4. Abnormal glucose  Chronic, controlled  Continue with current medications  Encouraged to limit intake of sugary foods and drinks  Encouraged to increase physical activity to 150 minutes per week - Hemoglobin A1c  5. Elevated lipids  Chronic  Slightly  elevated LDL   Encouraged to avoid fried and fatty foods. - Lipid panel  6. Vitamin D deficiency  Will check vitamin D level and supplement as needed.     Also encouraged to spend 15 minutes in the sun daily.  - Vitamin D (25 hydroxy)  7. 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  8. Gastroesophageal reflux disease without esophagitis  Chronic   Continue with pantoprazole daily.      Minette Brine, FNP

## 2018-09-20 NOTE — Patient Instructions (Addendum)

## 2018-09-21 LAB — HEMOGLOBIN A1C
ESTIMATED AVERAGE GLUCOSE: 114 mg/dL
HEMOGLOBIN A1C: 5.6 % (ref 4.8–5.6)

## 2018-09-21 LAB — CMP14 + ANION GAP
ALT: 17 IU/L (ref 0–32)
AST: 19 IU/L (ref 0–40)
Albumin/Globulin Ratio: 1.3 (ref 1.2–2.2)
Albumin: 3.5 g/dL — ABNORMAL LOW (ref 3.8–4.9)
Alkaline Phosphatase: 58 IU/L (ref 39–117)
Anion Gap: 13 mmol/L (ref 10.0–18.0)
BUN/Creatinine Ratio: 18 (ref 9–23)
BUN: 11 mg/dL (ref 6–24)
Bilirubin Total: 0.7 mg/dL (ref 0.0–1.2)
CO2: 23 mmol/L (ref 20–29)
Calcium: 9 mg/dL (ref 8.7–10.2)
Chloride: 106 mmol/L (ref 96–106)
Creatinine, Ser: 0.62 mg/dL (ref 0.57–1.00)
GFR calc Af Amer: 117 mL/min/{1.73_m2} (ref >59)
GFR calc non Af Amer: 101 mL/min/{1.73_m2} (ref >59)
Globulin, Total: 2.6 g/dL (ref 1.5–4.5)
Glucose: 86 mg/dL (ref 65–99)
Potassium: 3.9 mmol/L (ref 3.5–5.2)
Sodium: 142 mmol/L (ref 134–144)
Total Protein: 6.1 g/dL (ref 6.0–8.5)

## 2018-09-21 LAB — CBC
Hematocrit: 39.1 % (ref 34.0–46.6)
Hemoglobin: 13.1 g/dL (ref 11.1–15.9)
MCH: 29.4 pg (ref 26.6–33.0)
MCHC: 33.5 g/dL (ref 31.5–35.7)
MCV: 88 fL (ref 79–97)
PLATELETS: 295 10*3/uL (ref 150–450)
RBC: 4.45 x10E6/uL (ref 3.77–5.28)
RDW: 13.4 % (ref 11.7–15.4)
WBC: 7.6 10*3/uL (ref 3.4–10.8)

## 2018-09-21 LAB — HEPATITIS C ANTIBODY: Hep C Virus Ab: <0.1 s/co ratio (ref 0.0–0.9)

## 2018-09-21 LAB — LIPID PANEL
Chol/HDL Ratio: 3.6 ratio (ref 0.0–4.4)
Cholesterol, Total: 218 mg/dL — ABNORMAL HIGH (ref 100–199)
HDL: 61 mg/dL (ref >39)
LDL Calculated: 141 mg/dL — ABNORMAL HIGH (ref 0–99)
Triglycerides: 79 mg/dL (ref 0–149)
VLDL Cholesterol Cal: 16 mg/dL (ref 5–40)

## 2018-09-21 LAB — HIV ANTIBODY (ROUTINE TESTING W REFLEX): HIV Screen 4th Generation wRfx: NONREACTIVE

## 2018-09-21 LAB — VITAMIN D 25 HYDROXY (VIT D DEFICIENCY, FRACTURES): Vit D, 25-Hydroxy: 6.9 ng/mL — ABNORMAL LOW (ref 30.0–100.0)

## 2018-09-22 ENCOUNTER — Other Ambulatory Visit: Payer: Self-pay | Admitting: Nurse Practitioner

## 2018-09-22 DIAGNOSIS — E559 Vitamin D deficiency, unspecified: Secondary | ICD-10-CM

## 2018-09-22 MED ORDER — VITAMIN D (ERGOCALCIFEROL) 1.25 MG (50000 UNIT) PO CAPS: 50000.0000 [IU] | ORAL_CAPSULE | ORAL | 1 refills | Status: DC

## 2018-09-22 NOTE — Progress Notes (Signed)
She needs to return in 12 weeks for follow up vitamin d

## 2018-10-04 MED FILL — VIT D2 1.25 MG (50,000 UNIT: 1.25 MG | 84 days supply | Qty: 24 | Fill #0

## 2018-11-30 MED FILL — PANTOPRAZOLE SOD DR 40 MG T: 40 | 90 days supply | Qty: 90 | Fill #1

## 2019-03-11 ENCOUNTER — Other Ambulatory Visit: Payer: Self-pay | Admitting: Nurse Practitioner

## 2019-03-11 MED FILL — ALLOPURINOL 300 MG TAB: 300 | 90 days supply | Qty: 90 | Fill #1

## 2019-03-11 MED FILL — PANTOPRAZOLE SOD DR 40 MG T: 40 | 90 days supply | Qty: 90 | Fill #0

## 2019-03-18 ENCOUNTER — Other Ambulatory Visit: Payer: Self-pay

## 2019-03-18 ENCOUNTER — Other Ambulatory Visit: Payer: Self-pay | Admitting: Family

## 2019-03-18 ENCOUNTER — Ambulatory Visit (INDEPENDENT_AMBULATORY_CARE_PROVIDER_SITE_OTHER): Payer: 59 | Admitting: Family

## 2019-03-18 ENCOUNTER — Telehealth: Payer: Self-pay

## 2019-03-18 ENCOUNTER — Encounter: Payer: Self-pay | Admitting: Family

## 2019-03-18 VITALS — BP 128/84 | HR 78 | Temp 97.5°F | Ht 62.4 in | Wt 159.6 lb

## 2019-03-18 DIAGNOSIS — R1084 Generalized abdominal pain: Secondary | ICD-10-CM

## 2019-03-18 DIAGNOSIS — R079 Chest pain, unspecified: Secondary | ICD-10-CM | POA: Diagnosis not present

## 2019-03-18 MED ORDER — ONDANSETRON 8 MG PO TBDP: 8.0000 mg | ORAL_TABLET | Freq: Three times a day (TID) | ORAL | 0 refills | Status: DC | PRN

## 2019-03-18 MED FILL — ONDANSETRON ODT 8 MG TABLET: 8 | 10 days supply | Qty: 30 | Fill #0

## 2019-03-18 NOTE — Progress Notes (Signed)
Jade Owen is a 58 y.o. female with the following history as recorded in [employer]:  Patient Active Problem List   Diagnosis Date Noted  . Palpitations 09/20/2018  . Abnormal glucose 09/20/2018  . Elevated lipids 09/20/2018  . Vitamin D deficiency 09/20/2018  . Gout 09/20/2018  . Effusion of left knee 12/18/2017  . Achilles tendinosis 12/15/2013  . Other bursal cyst, right ankle and foot 07/19/2013  . ALLERGIC RHINITIS 12/06/2007  . Gastroesophageal reflux disease without esophagitis 12/06/2007    Current Outpatient Medications  Medication Sig Dispense Refill  . allopurinol (ZYLOPRIM) 300 MG tablet TAKE 1 TABLET BY MOUTH ONCE DAILY 90 tablet 1  . colchicine 0.6 MG tablet Take 2 tablets by mouth at the onset of a flare and 1 tablet an hour later. 10 tablet 0  . cyclobenzaprine (FLEXERIL) 5 MG tablet Take 5 mg by mouth as needed for muscle spasms.    . metoprolol tartrate (LOPRESSOR) 50 MG tablet Take 1 tablet (50 mg total) by mouth daily. 90 tablet 1  . pantoprazole (PROTONIX) 40 MG tablet TAKE 1 TABLET BY MOUTH ONCE DAILY 90 tablet 1  . Vitamin D, Ergocalciferol, (DRISDOL) 1.25 MG (50000 UT) CAPS capsule Take 1 capsule (50,000 Units total) by mouth 2 (two) times a week. 24 capsule 1   No current facility-administered medications for this visit.     Allergies: Patient has no known allergies.  Past Medical History:  Diagnosis Date  . Acid reflux     History reviewed. No pertinent surgical history.  Family History  Problem Relation Age of Onset  . Breast cancer Neg Hx     Social History   Tobacco Use  . Smoking status: Never Smoker  . Smokeless tobacco: Never Used  Substance Use Topics  . Alcohol use: No    Subjective:  Patient presents with concerns for 24 hours of episodic chest pain, nausea, increased burping and belching and diarrhea; notes that she ate chicken salad for lunch and fried chicken for dinner; symptoms seemed more pronounced after eating the fried  chicken. Has started with diarrhea this morning and is actually feeling a little better; no chest pain or shortness of breath on exertion; no headache or dizziness; has not felt her heart beating irregularly; +FH of CAD; no known family history of gallbladder disease; admits that the symptoms have actually started to improve as the morning has progressed.     Objective:  Vitals:   03/18/19 1006  BP: 128/84  Pulse: 78  Temp: (!) 97.5 F (36.4 C)  TempSrc: Oral  SpO2: 97%  Weight: 159 lb 9.6 oz (72.4 kg)  Height: 5' 2.4" (1.585 m)    General: Well developed, well nourished, in no acute distress  Skin : Warm and dry.  Head: Normocephalic and atraumatic  Lungs: Respirations unlabored; clear to auscultation bilaterally without wheeze, rales, rhonchi  CVS exam: normal rate and regular rhythm.  Abdomen: Soft; nontender; nondistended; normoactive bowel sounds; no masses or hepatosplenomegaly  Neurologic: Alert and oriented; speech intact; face symmetrical; moves all extremities well; CNII-XII intact without focal deficit   Assessment:  1. Chest pain, unspecified type   2. Generalized abdominal pain     Plan:  Low suspicion for cardiac source- EKG does not show acute changes; ? Food illness or gastritis; also discussed possibility of gallbladder source; increase Protonix to bid x 5-7 days; will update abdominal ultrasound; try to avoid fried foods for now; follow up to be determined; Strict ER precautions discussed.  No follow-ups on file.  Orders Placed This Encounter  Procedures  . US Abdomen Complete    Standing Status:   Future    Standing Expiration Date:   05/17/2020    Order Specific Question:   Reason for Exam (SYMPTOM  OR DIAGNOSIS REQUIRED)    Answer:   abdominal pain    Order Specific Question:   Preferred imaging location?    Answer:   [facility]  . EKG 12-Lead    Requested Prescriptions    No prescriptions requested or ordered in this encounter

## 2019-03-22 NOTE — Telephone Encounter (Signed)
Script sent in on Friday for patient.

## 2019-03-29 ENCOUNTER — Other Ambulatory Visit: Payer: Self-pay | Admitting: Family

## 2019-03-29 ENCOUNTER — Other Ambulatory Visit (INDEPENDENT_AMBULATORY_CARE_PROVIDER_SITE_OTHER): Payer: 59

## 2019-03-29 DIAGNOSIS — R109 Unspecified abdominal pain: Secondary | ICD-10-CM

## 2019-03-29 LAB — COMPREHENSIVE METABOLIC PANEL
ALT: 16 U/L (ref 0–35)
AST: 16 U/L (ref 0–37)
Albumin: 3.8 g/dL (ref 3.5–5.2)
Alkaline Phosphatase: 49 U/L (ref 39–117)
BUN: 11 mg/dL (ref 6–23)
CO2: 28 mEq/L (ref 19–32)
Calcium: 9 mg/dL (ref 8.4–10.5)
Chloride: 105 mEq/L (ref 96–112)
Creatinine, Ser: 0.74 mg/dL (ref 0.40–1.20)
GFR: 97.77 mL/min (ref >60.00)
Glucose, Bld: 75 mg/dL (ref 70–99)
Potassium: 4.2 mEq/L (ref 3.5–5.1)
Sodium: 139 mEq/L (ref 135–145)
Total Bilirubin: 0.5 mg/dL (ref 0.2–1.2)
Total Protein: 6.3 g/dL (ref 6.0–8.3)

## 2019-03-29 LAB — CBC WITH DIFFERENTIAL/PLATELET
Basophils Absolute: 0.1 10*3/uL (ref 0.0–0.1)
Basophils Relative: 1.1 % (ref 0.0–3.0)
Eosinophils Absolute: 0.2 10*3/uL (ref 0.0–0.7)
Eosinophils Relative: 3.2 % (ref 0.0–5.0)
HCT: 40.6 % (ref 36.0–46.0)
Hemoglobin: 13.7 g/dL (ref 12.0–15.0)
Lymphocytes Relative: 36.6 % (ref 12.0–46.0)
Lymphs Abs: 2.8 10*3/uL (ref 0.7–4.0)
MCHC: 33.7 g/dL (ref 30.0–36.0)
MCV: 88.7 fl (ref 78.0–100.0)
Monocytes Absolute: 0.5 10*3/uL (ref 0.1–1.0)
Monocytes Relative: 6.6 % (ref 3.0–12.0)
Neutro Abs: 4 10*3/uL (ref 1.4–7.7)
Neutrophils Relative %: 52.5 % (ref 43.0–77.0)
Platelets: 298 10*3/uL (ref 150.0–400.0)
RBC: 4.58 Mil/uL (ref 3.87–5.11)
RDW: 13.2 % (ref 11.5–15.5)
WBC: 7.7 10*3/uL (ref 4.0–10.5)

## 2019-03-30 ENCOUNTER — Telehealth: Payer: Self-pay

## 2019-03-30 NOTE — Telephone Encounter (Signed)
Message sent to patient via mychart

## 2019-04-08 DIAGNOSIS — H52203 Unspecified astigmatism, bilateral: Secondary | ICD-10-CM | POA: Diagnosis not present

## 2019-04-27 ENCOUNTER — Telehealth: Payer: Self-pay

## 2019-04-29 ENCOUNTER — Other Ambulatory Visit: Payer: Self-pay | Admitting: Family

## 2019-04-29 DIAGNOSIS — R109 Unspecified abdominal pain: Secondary | ICD-10-CM

## 2019-04-29 MED ORDER — PANTOPRAZOLE SODIUM 40 MG PO TBEC: 40.0000 mg | DELAYED_RELEASE_TABLET | Freq: Two times a day (BID) | ORAL | 0 refills | Status: DC

## 2019-05-04 MED FILL — PANTOPRAZOLE SOD DR 40 MG T: 40 | 90 days supply | Qty: 180 | Fill #0

## 2019-05-05 ENCOUNTER — Ambulatory Visit
Admission: RE | Admit: 2019-05-05 | Discharge: 2019-05-05 | Disposition: A | Discharge status: Home / Self Care | Payer: 59 | Source: Ambulatory Visit | Attending: Family | Admitting: Family

## 2019-05-05 DIAGNOSIS — R109 Unspecified abdominal pain: Secondary | ICD-10-CM | POA: Diagnosis not present

## 2019-05-06 NOTE — Telephone Encounter (Signed)
This has been sent in for patient

## 2019-05-10 ENCOUNTER — Other Ambulatory Visit: Payer: 59

## 2019-07-06 ENCOUNTER — Other Ambulatory Visit: Payer: Self-pay | Admitting: Family Medicine

## 2019-07-07 ENCOUNTER — Other Ambulatory Visit: Payer: Self-pay

## 2019-07-07 MED ORDER — ALLOPURINOL 300 MG PO TABS: 300.0000 mg | ORAL_TABLET | Freq: Every day | ORAL | 1 refills | Status: DC

## 2019-07-07 MED FILL — ALLOPURINOL 300 MG TABS: 300 | 90 days supply | Qty: 90 | Fill #0

## 2019-08-15 LAB — HM COLONOSCOPY

## 2019-08-22 ENCOUNTER — Telehealth: Payer: Self-pay | Admitting: Family Medicine

## 2019-08-22 MED ORDER — COLCHICINE 0.6 MG PO TABS: ORAL_TABLET | ORAL | 0 refills | Status: DC

## 2019-08-22 MED FILL — COLCHICINE 0.6 MG TABS: 0.6 | 3 days supply | Qty: 10 | Fill #0

## 2019-08-22 NOTE — Telephone Encounter (Signed)
Thank you-Patient informed.

## 2019-08-22 NOTE — Telephone Encounter (Signed)
Patient called stating that she is having a gout flare up in her knee and she would like a refill on colchicine 0.6 MG tablet sent to [facility].   Can this be sent in for her?

## 2019-08-25 ENCOUNTER — Other Ambulatory Visit: Payer: Self-pay | Admitting: Nurse Practitioner

## 2019-08-25 DIAGNOSIS — Z1231 Encounter for screening mammogram for malignant neoplasm of breast: Secondary | ICD-10-CM

## 2019-09-23 ENCOUNTER — Encounter: Payer: 59 | Admitting: Nurse Practitioner

## 2019-10-18 ENCOUNTER — Encounter: Payer: 59 | Admitting: Nurse Practitioner

## 2019-10-24 ENCOUNTER — Ambulatory Visit
Admission: RE | Admit: 2019-10-24 | Discharge: 2019-10-24 | Disposition: A | Discharge status: Home / Self Care | Payer: 59 | Source: Ambulatory Visit | Attending: Nurse Practitioner | Admitting: Nurse Practitioner

## 2019-10-24 ENCOUNTER — Other Ambulatory Visit: Payer: Self-pay

## 2019-10-24 ENCOUNTER — Ambulatory Visit (INDEPENDENT_AMBULATORY_CARE_PROVIDER_SITE_OTHER): Payer: 59 | Admitting: Nurse Practitioner

## 2019-10-24 ENCOUNTER — Encounter: Payer: Self-pay | Admitting: Nurse Practitioner

## 2019-10-24 VITALS — BP 122/80 | HR 72 | Temp 98.1°F | Ht 62.4 in | Wt 166.6 lb

## 2019-10-24 DIAGNOSIS — Z0000 Encounter for general adult medical examination without abnormal findings: Secondary | ICD-10-CM | POA: Diagnosis not present

## 2019-10-24 DIAGNOSIS — R002 Palpitations: Secondary | ICD-10-CM | POA: Diagnosis not present

## 2019-10-24 DIAGNOSIS — E559 Vitamin D deficiency, unspecified: Secondary | ICD-10-CM | POA: Diagnosis not present

## 2019-10-24 DIAGNOSIS — R7309 Other abnormal glucose: Secondary | ICD-10-CM | POA: Diagnosis not present

## 2019-10-24 DIAGNOSIS — Z23 Encounter for immunization: Secondary | ICD-10-CM | POA: Diagnosis not present

## 2019-10-24 DIAGNOSIS — Z1231 Encounter for screening mammogram for malignant neoplasm of breast: Secondary | ICD-10-CM | POA: Diagnosis not present

## 2019-10-24 DIAGNOSIS — M1A4620 Other secondary chronic gout, left knee, without tophus (tophi): Secondary | ICD-10-CM

## 2019-10-24 DIAGNOSIS — E785 Hyperlipidemia, unspecified: Secondary | ICD-10-CM | POA: Diagnosis not present

## 2019-10-24 LAB — POCT URINALYSIS DIPSTICK
Bilirubin, UA: NEGATIVE
Blood, UA: NEGATIVE
Glucose, UA: NEGATIVE
Ketones, UA: NEGATIVE
Leukocytes, UA: NEGATIVE
Nitrite, UA: NEGATIVE
Protein, UA: NEGATIVE
Spec Grav, UA: 1.02 (ref 1.010–1.025)
Urobilinogen, UA: 0.2 E.U./dL
pH, UA: 7.5 (ref 5.0–8.0)

## 2019-10-24 MED ORDER — PANTOPRAZOLE SODIUM 40 MG PO TBEC: 40.0000 mg | DELAYED_RELEASE_TABLET | Freq: Every day | ORAL | 1 refills | Status: DC

## 2019-10-24 MED ORDER — VITAMIN D (ERGOCALCIFEROL) 1.25 MG (50000 UNIT) PO CAPS: 50000.0000 [IU] | ORAL_CAPSULE | ORAL | 1 refills | Status: DC

## 2019-10-24 MED ORDER — METOPROLOL TARTRATE 50 MG PO TABS: 50.0000 mg | ORAL_TABLET | Freq: Every day | ORAL | 1 refills | Status: DC

## 2019-10-24 MED ORDER — TETANUS-DIPHTH-ACELL PERTUSSIS 5-2.5-18.5 LF-MCG/0.5 IM SUSP
0.5000 mL | Freq: Once | INTRAMUSCULAR | Status: AC
  Administered 2019-10-24: 10:00:00 0.5 mL via INTRAMUSCULAR

## 2019-10-24 MED FILL — PANTOPRAZOLE SOD DR 40 MG T: 40 | 90 days supply | Qty: 90 | Fill #0

## 2019-10-24 MED FILL — VIT D2 1.25 MG (50,000 UNIT: 1.25 MG | 84 days supply | Qty: 24 | Fill #0

## 2019-10-24 MED FILL — METOPROLOL TARTRATE 50 MG T: 50 | 90 days supply | Qty: 90 | Fill #0

## 2019-10-24 NOTE — Progress Notes (Signed)
Subjective:     Patient ID: Jade Owen , female    DOB: [birth year] , 59 y.o.   MRN: [id number]   Chief Complaint  Patient presents with  . Annual Exam   HPI   Here for HM  Gout - she is taking colchicine as needed.  When she eats red meat she has a flare most recent in December.  Left knee.    Her brother (colon cancer) passed in May and her mother passed in June   She reports having a fall 6 months ago at a [employer] and injured her ankle.  She denies being seen at a provider after the fall.   Gastroesophageal Reflux She reports no abdominal pain or no wheezing. This is a chronic problem. The current episode started more than 1 year ago. The problem occurs rarely. The problem has been unchanged. Pertinent negatives include no fatigue. She has tried a histamine-2 antagonist for the symptoms. The treatment provided moderate relief.   The patient uses IUD for birth control. Negative for Dysmenorrhea and Negative for Menorrhagia Mammogram last done 09/20/2018. Negative for: breast discharge, breast lump(s), breast pain and breast self exam.  Pertinent negatives include abnormal bleeding (hematology), anxiety, decreased libido, depression, difficulty falling sleep, dyspareunia, history of infertility, nocturia, sexual dysfunction, sleep disturbances, urinary incontinence, urinary urgency, vaginal discharge and vaginal itching. Diet unhealthy. The patient states her exercise level is minimal.     The patient's tobacco use is:  Social History   Tobacco Use  Smoking Status Never Smoker  Smokeless Tobacco Never Used   She has been exposed to passive smoke. 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 Acid reflux      Family History  Problem Relation Age of Onset  . Breast cancer Neg Hx      Current Outpatient Medications:   .  allopurinol (ZYLOPRIM) 300 MG tablet, Take 1 tablet (300 mg total) by mouth daily., Disp: 90 tablet, Rfl: 1 .  colchicine 0.6 MG tablet, Take 2 tablets by mouth at the onset of a flare and 1 tablet an hour later., Disp: 10 tablet, Rfl: 0 .  cyclobenzaprine (FLEXERIL) 5 MG tablet, Take 5 mg by mouth as needed for muscle spasms., Disp: , Rfl:  .  metoprolol tartrate (LOPRESSOR) 50 MG tablet, Take 1 tablet (50 mg total) by mouth daily., Disp: 90 tablet, Rfl: 1 .  pantoprazole (PROTONIX) 40 MG tablet, TAKE 1 TABLET BY MOUTH ONCE DAILY, Disp: 90 tablet, Rfl: 1 .  ondansetron (ZOFRAN ODT) 8 MG disintegrating tablet, Take 1 tablet (8 mg total) by mouth every 8 (eight) hours as needed for nausea or vomiting. (Patient not taking: Reported on 10/24/2019), Disp: 30 tablet, Rfl: 0 .  Vitamin D, Ergocalciferol, (DRISDOL) 1.25 MG (50000 UT) CAPS capsule, Take 1 capsule (50,000 Units total) by mouth 2 (two) times a week. (Patient not taking: Reported on 10/24/2019), Disp: 24 capsule, Rfl: 1   No Known Allergies   Review of Systems  Constitutional: Negative.  Negative for fatigue.  HENT: Negative.   Eyes: Negative.   Respiratory: Negative.  Negative for wheezing.   Cardiovascular: Negative.  Negative for palpitations and leg swelling.  Gastrointestinal: Negative.  Negative for abdominal pain.  Endocrine: Negative.  Negative for polydipsia, polyphagia and polyuria.  Genitourinary: Negative.   Musculoskeletal: Negative.  Skin: Negative.   Allergic/Immunologic: Negative.   Neurological: Negative.  Negative for dizziness and headaches.  Hematological: Negative.   Psychiatric/Behavioral: Negative.      Today's Vitals   10/24/19 0859  BP: 122/80  Pulse: 72  Temp: 98.1 F (36.7 C)  Weight: 166 lb 9.6 oz (75.6 kg)  Height: 5' 2.4" (1.585 m)  PainSc: 0-No pain   Body mass index is 30.08 kg/m.   Objective:  Physical Exam Constitutional:      General: She is not in acute distress.    Appearance:  Normal appearance. She is well-developed.  HENT:     Head: Normocephalic and atraumatic.     Right Ear: Hearing, tympanic membrane, ear canal and external ear normal. There is no impacted cerumen.     Left Ear: Hearing, tympanic membrane, ear canal and external ear normal. There is no impacted cerumen.     Nose:     Comments: Deferred - mask    Mouth/Throat:     Comments: Deferred - mask Eyes:     General: Lids are normal.     Extraocular Movements: Extraocular movements intact.     Conjunctiva/sclera: Conjunctivae normal.     Pupils: Pupils are equal, round, and reactive to light.     Funduscopic exam:    Right eye: No papilledema.        Left eye: No papilledema.  Neck:     Thyroid: No thyroid mass.     Vascular: No carotid bruit.  Cardiovascular:     Rate and Rhythm: Normal rate and regular rhythm.     Pulses: Normal pulses.     Heart sounds: Normal heart sounds. No murmur.  Pulmonary:     Effort: Pulmonary effort is normal.     Breath sounds: Normal breath sounds.  Abdominal:     General: Abdomen is flat. Bowel sounds are normal.     Palpations: Abdomen is soft.  Musculoskeletal:        General: No swelling. Normal range of motion.     Cervical back: Full passive range of motion without pain, normal range of motion and neck supple.     Right lower leg: No edema.     Left lower leg: No edema.  Skin:    General: Skin is warm and dry.     Capillary Refill: Capillary refill takes less than 2 seconds.  Neurological:     General: No focal deficit present.     Mental Status: She is alert and oriented to person, place, and time.     Cranial Nerves: No cranial nerve deficit.     Sensory: No sensory deficit.  Psychiatric:        Mood and Affect: Mood normal.        Behavior: Behavior normal.        Thought Content: Thought content normal.        Judgment: Judgment normal.         Assessment And Plan:    1. Encounter for general adult medical examination w/o abnormal  findings . Behavior modifications discussed and diet history reviewed.   . Pt will continue to exercise regularly and modify diet with low GI, plant based foods and decrease intake of processed foods.  . Recommend intake of daily multivitamin, Vitamin D, and calcium.  . Recommend mammogram (today) and colonoscopy (up to date due in 5 years) for preventive screenings, as well as recommend immunizations that include influenza, TDAP (given today in office) - POCT Urinalysis  Dipstick (81002)  2. Vitamin D deficiency  Will check vitamin D level and supplement as needed.     Also encouraged to spend 15 minutes in the sun daily.   - VITAMIN D 25 Hydroxy (Vit-D Deficiency, Fractures)  3. 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  4. Encounter for immunization  Will give tetanus vaccine today while in office. Refer to order management. TDAP will be administered to adults 24-43 years old every 10 years. - Tdap (BOOSTRIX) injection 0.5 mL  5. Abnormal glucose  Chronic, fair control  Will check HgbA1c - Hemoglobin A1c  6. Elevated lipids  Chronic, diet controlled - Lipid panel  7. Palpitations  Denies any palpitations, she has not been taking the lopressor regularly, I have encouraged her to take her medication more regularly to avoid cardiac events with the risk to include stroke or MI, she is also encouraged to stay well hydrated with water - CMP14+EGFR - CBC    Minette Brine, FNP

## 2019-10-24 NOTE — Patient Instructions (Signed)
Health Maintenance, Female Adopting a healthy lifestyle and getting preventive care are important in promoting health and wellness. Ask your health care provider about:  The right schedule for you to have regular tests and exams.  Things you can do on your own to prevent diseases and keep yourself healthy. What should I know about diet, weight, and exercise? Eat a healthy diet   Eat a diet that includes plenty of vegetables, fruits, low-fat dairy products, and lean protein.  Do not eat a lot of foods that are high in solid fats, added sugars, or sodium. Maintain a healthy weight Body mass index (BMI) is used to identify weight problems. It estimates body fat based on height and weight. Your health care provider can help determine your BMI and help you achieve or maintain a healthy weight. Get regular exercise Get regular exercise. This is one of the most important things you can do for your health. Most adults should:  Exercise for at least 150 minutes each week. The exercise should increase your heart rate and make you sweat (moderate-intensity exercise).  Do strengthening exercises at least twice a week. This is in addition to the moderate-intensity exercise.  Spend less time sitting. Even light physical activity can be beneficial. Watch cholesterol and blood lipids Have your blood tested for lipids and cholesterol at 58 years of age, then have this test every 5 years. Have your cholesterol levels checked more often if:  Your lipid or cholesterol levels are high.  You are older than 58 years of age.  You are at high risk for heart disease. What should I know about cancer screening? Depending on your health history and family history, you may need to have cancer screening at various ages. This may include screening for:  Breast cancer.  Cervical cancer.  Colorectal cancer.  Skin cancer.  Lung cancer. What should I know about heart disease, diabetes, and high blood  pressure? Blood pressure and heart disease  High blood pressure causes heart disease and increases the risk of stroke. This is more likely to develop in people who have high blood pressure readings, are of African descent, or are overweight.  Have your blood pressure checked: ? Every 3-5 years if you are 18-39 years of age. ? Every year if you are 40 years old or older. Diabetes Have regular diabetes screenings. This checks your fasting blood sugar level. Have the screening done:  Once every three years after age 40 if you are at a normal weight and have a low risk for diabetes.  More often and at a younger age if you are overweight or have a high risk for diabetes. What should I know about preventing infection? Hepatitis B If you have a higher risk for hepatitis B, you should be screened for this virus. Talk with your health care provider to find out if you are at risk for hepatitis B infection. Hepatitis C Testing is recommended for:  Everyone born from 1945 through 1965.  Anyone with known risk factors for hepatitis C. Sexually transmitted infections (STIs)  Get screened for STIs, including gonorrhea and chlamydia, if: ? You are sexually active and are younger than 58 years of age. ? You are older than 58 years of age and your health care provider tells you that you are at risk for this type of infection. ? Your sexual activity has changed since you were last screened, and you are at increased risk for chlamydia or gonorrhea. Ask your health care provider if   you are at risk.  Ask your health care provider about whether you are at high risk for HIV. Your health care provider may recommend a prescription medicine to help prevent HIV infection. If you choose to take medicine to prevent HIV, you should first get tested for HIV. You should then be tested every 3 months for as long as you are taking the medicine. Pregnancy  If you are about to stop having your period (premenopausal) and  you may become pregnant, seek counseling before you get pregnant.  Take 400 to 800 micrograms (mcg) of folic acid every day if you become pregnant.  Ask for birth control (contraception) if you want to prevent pregnancy. Osteoporosis and menopause Osteoporosis is a disease in which the bones lose minerals and strength with aging. This can result in bone fractures. If you are 65 years old or older, or if you are at risk for osteoporosis and fractures, ask your health care provider if you should:  Be screened for bone loss.  Take a calcium or vitamin D supplement to lower your risk of fractures.  Be given hormone replacement therapy (HRT) to treat symptoms of menopause. Follow these instructions at home: Lifestyle  Do not use any products that contain nicotine or tobacco, such as cigarettes, e-cigarettes, and chewing tobacco. If you need help quitting, ask your health care provider.  Do not use street drugs.  Do not share needles.  Ask your health care provider for help if you need support or information about quitting drugs. Alcohol use  Do not drink alcohol if: ? Your health care provider tells you not to drink. ? You are pregnant, may be pregnant, or are planning to become pregnant.  If you drink alcohol: ? Limit how much you use to 0-1 drink a day. ? Limit intake if you are breastfeeding.  Be aware of how much alcohol is in your drink. In the [place] one drink equals one 12 oz bottle of beer (355 mL), one 5 oz glass of wine (148 mL), or one 1 oz glass of hard liquor (44 mL). General instructions  Schedule regular health, dental, and eye exams.  Stay current with your vaccines.  Tell your health care provider if: ? You often feel depressed. ? You have ever been abused or do not feel safe at home. Summary  Adopting a healthy lifestyle and getting preventive care are important in promoting health and wellness.  Follow your health care provider's instructions about healthy  diet, exercising, and getting tested or screened for diseases.  Follow your health care provider's instructions on monitoring your cholesterol and blood pressure. This information is not intended to replace advice given to you by your health care provider. Make sure you discuss any questions you have with your health care provider. Document Revised: 07/28/2018 Document Reviewed: 07/28/2018 Elsevier Patient Education  2020 Elsevier Inc.  

## 2019-10-25 LAB — HEMOGLOBIN A1C
Est. average glucose Bld gHb Est-mCnc: 114 mg/dL
Hgb A1c MFr Bld: 5.6 % (ref 4.8–5.6)

## 2019-10-25 LAB — LIPID PANEL
Chol/HDL Ratio: 3.5 ratio (ref 0.0–4.4)
Cholesterol, Total: 209 mg/dL — ABNORMAL HIGH (ref 100–199)
HDL: 60 mg/dL (ref >39)
LDL Chol Calc (NIH): 134 mg/dL — ABNORMAL HIGH (ref 0–99)
Triglycerides: 84 mg/dL (ref 0–149)
VLDL Cholesterol Cal: 15 mg/dL (ref 5–40)

## 2019-10-25 LAB — CBC
Hematocrit: 42.1 % (ref 34.0–46.6)
Hemoglobin: 13.4 g/dL (ref 11.1–15.9)
MCH: 28 pg (ref 26.6–33.0)
MCHC: 31.8 g/dL (ref 31.5–35.7)
MCV: 88 fL (ref 79–97)
Platelets: 309 10*3/uL (ref 150–450)
RBC: 4.78 x10E6/uL (ref 3.77–5.28)
RDW: 12.9 % (ref 11.7–15.4)
WBC: 6.3 10*3/uL (ref 3.4–10.8)

## 2019-10-25 LAB — CMP14+EGFR
ALT: 16 IU/L (ref 0–32)
AST: 15 IU/L (ref 0–40)
Albumin/Globulin Ratio: 1.7 (ref 1.2–2.2)
Albumin: 3.6 g/dL — ABNORMAL LOW (ref 3.8–4.9)
Alkaline Phosphatase: 52 IU/L (ref 39–117)
BUN/Creatinine Ratio: 15 (ref 9–23)
BUN: 11 mg/dL (ref 6–24)
Bilirubin Total: 0.6 mg/dL (ref 0.0–1.2)
CO2: 23 mmol/L (ref 20–29)
Calcium: 9 mg/dL (ref 8.7–10.2)
Chloride: 106 mmol/L (ref 96–106)
Creatinine, Ser: 0.75 mg/dL (ref 0.57–1.00)
GFR calc Af Amer: 102 mL/min/{1.73_m2} (ref >59)
GFR calc non Af Amer: 89 mL/min/{1.73_m2} (ref >59)
Globulin, Total: 2.1 g/dL (ref 1.5–4.5)
Glucose: 89 mg/dL (ref 65–99)
Potassium: 4 mmol/L (ref 3.5–5.2)
Sodium: 143 mmol/L (ref 134–144)
Total Protein: 5.7 g/dL — ABNORMAL LOW (ref 6.0–8.5)

## 2019-10-25 LAB — VITAMIN D 25 HYDROXY (VIT D DEFICIENCY, FRACTURES): Vit D, 25-Hydroxy: 12.2 ng/mL — ABNORMAL LOW (ref 30.0–100.0)

## 2019-10-26 ENCOUNTER — Other Ambulatory Visit: Payer: Self-pay

## 2019-10-26 ENCOUNTER — Encounter: Payer: Self-pay | Admitting: Nurse Practitioner

## 2019-10-26 MED ORDER — CYCLOBENZAPRINE HCL 5 MG PO TABS: 5.0000 mg | ORAL_TABLET | ORAL | 0 refills | Status: DC | PRN

## 2019-10-26 MED FILL — CYCLOBENZAPRINE HCL 5 MG TA: 5 | 20 days supply | Qty: 20 | Fill #0

## 2019-12-12 ENCOUNTER — Other Ambulatory Visit: Payer: Self-pay

## 2019-12-12 MED ORDER — ALLOPURINOL 300 MG PO TABS: 300.0000 mg | ORAL_TABLET | Freq: Every day | ORAL | 1 refills | Status: DC

## 2019-12-12 MED FILL — ALLOPURINOL 300 MG TABS: 300 | 90 days supply | Qty: 90 | Fill #0

## 2019-12-25 IMAGING — MG DIGITAL SCREENING BILATERAL MAMMOGRAM WITH CAD
4 series · 4 of 4 positions shown · non-contrast
Comparison: Previous exam(s).

CLINICAL DATA: Screening.

EXAM:
DIGITAL SCREENING BILATERAL MAMMOGRAM WITH CAD

[R CC]
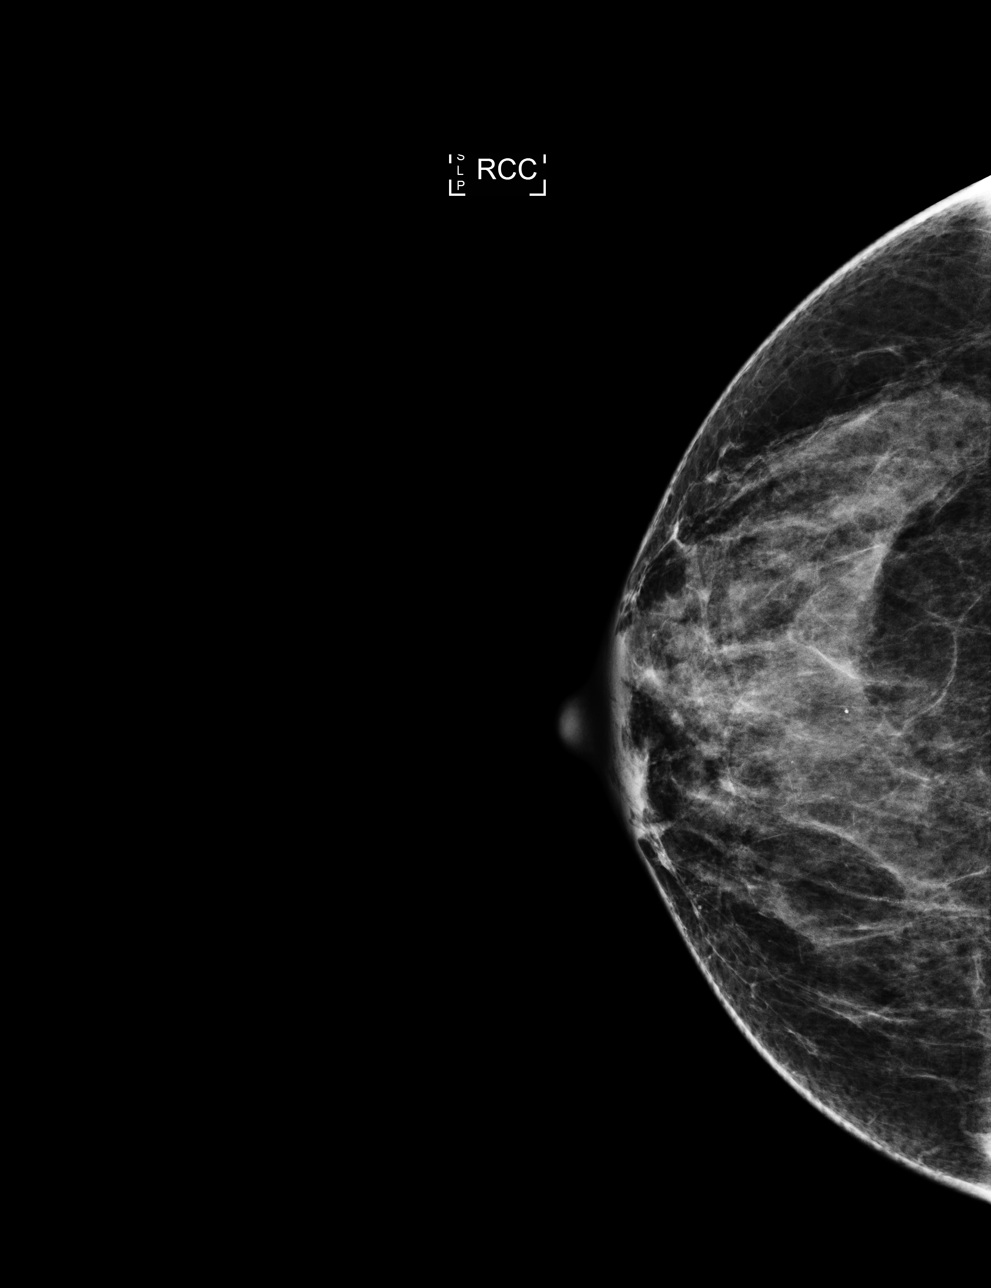

[R MLO]
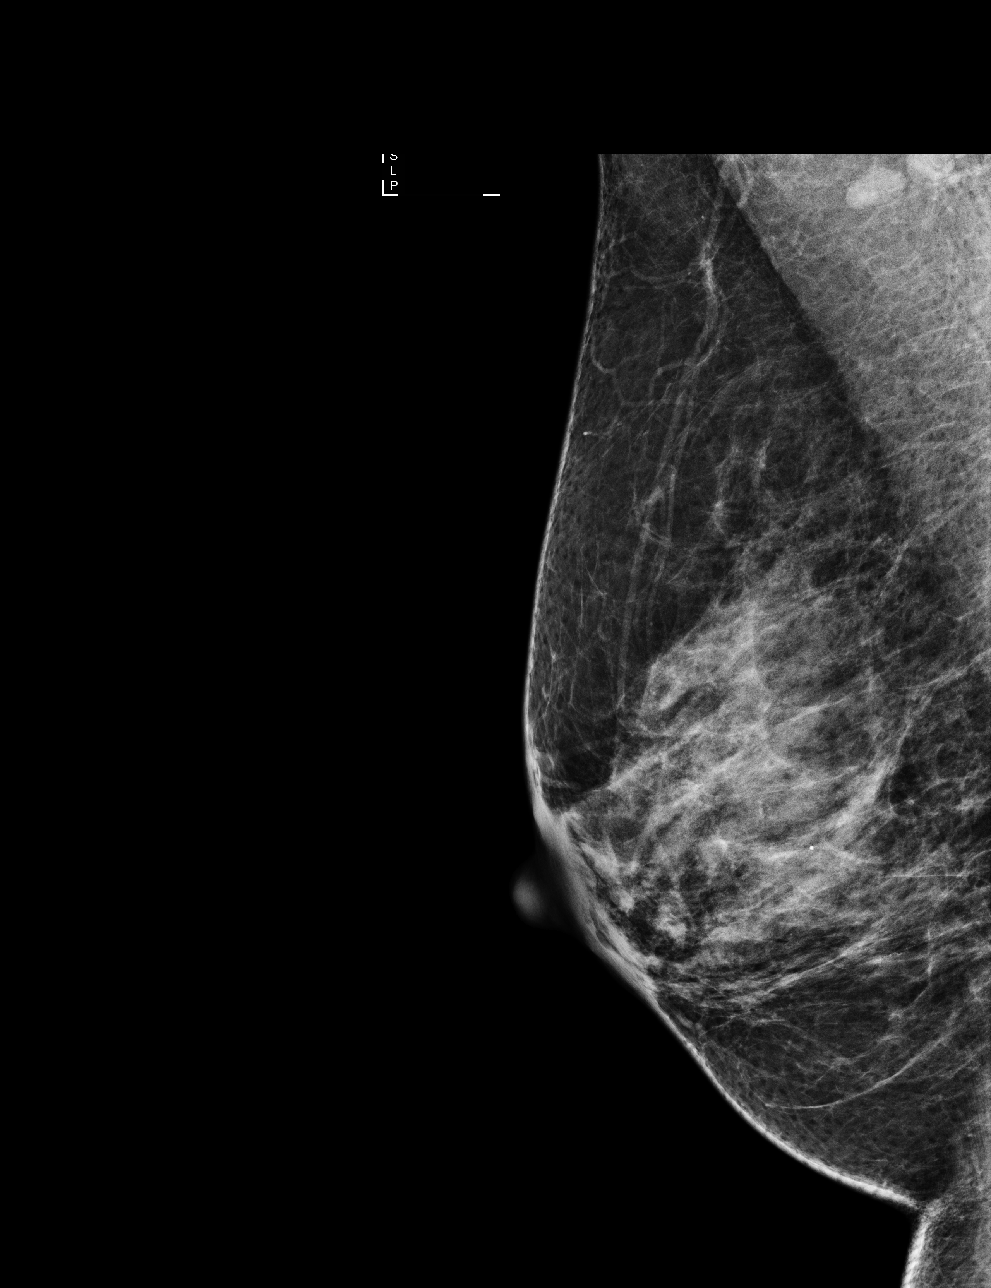

[L CC]
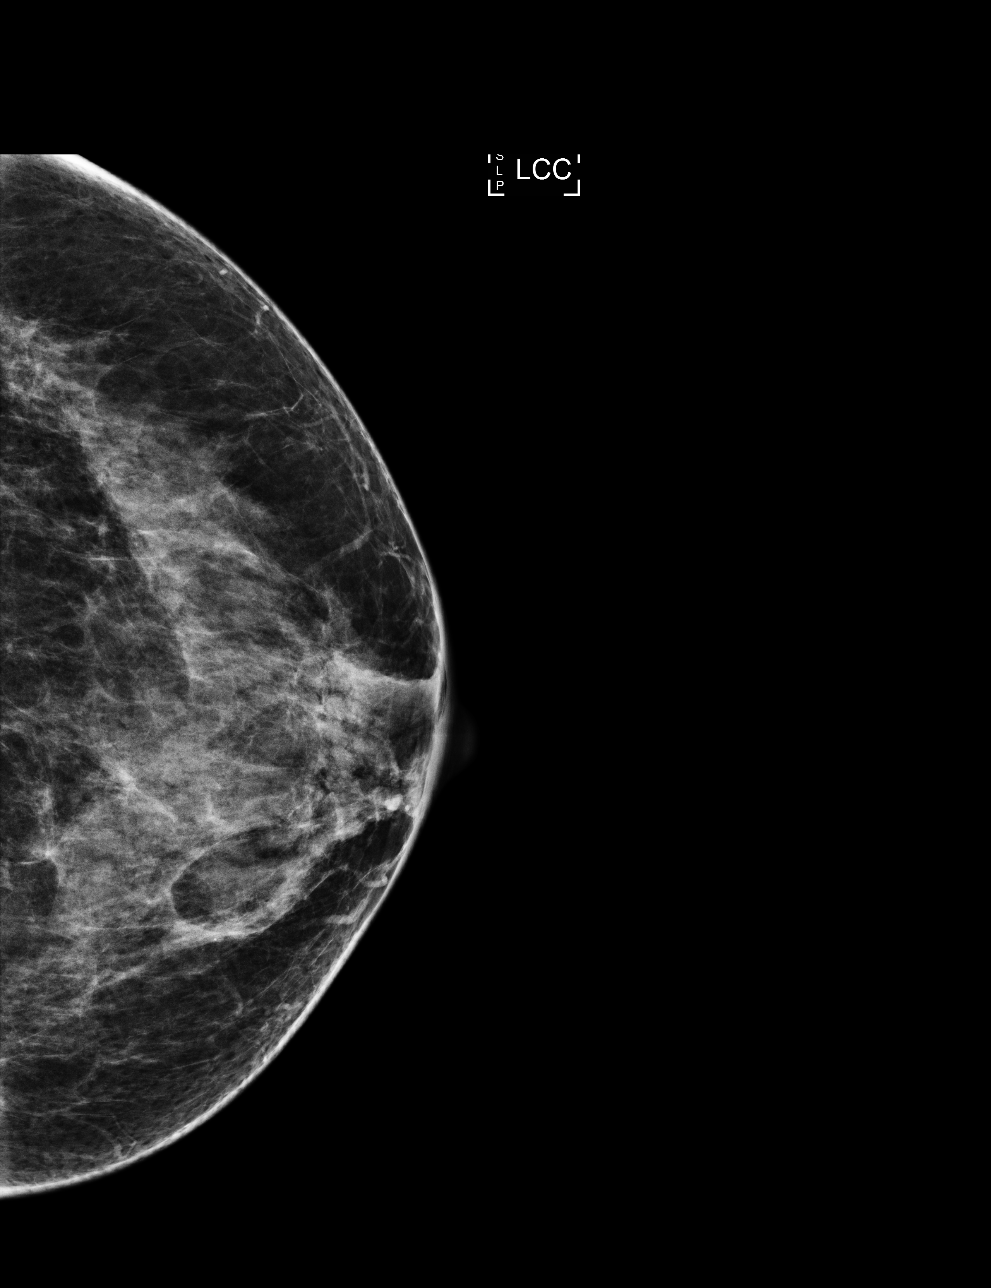

[L MLO]
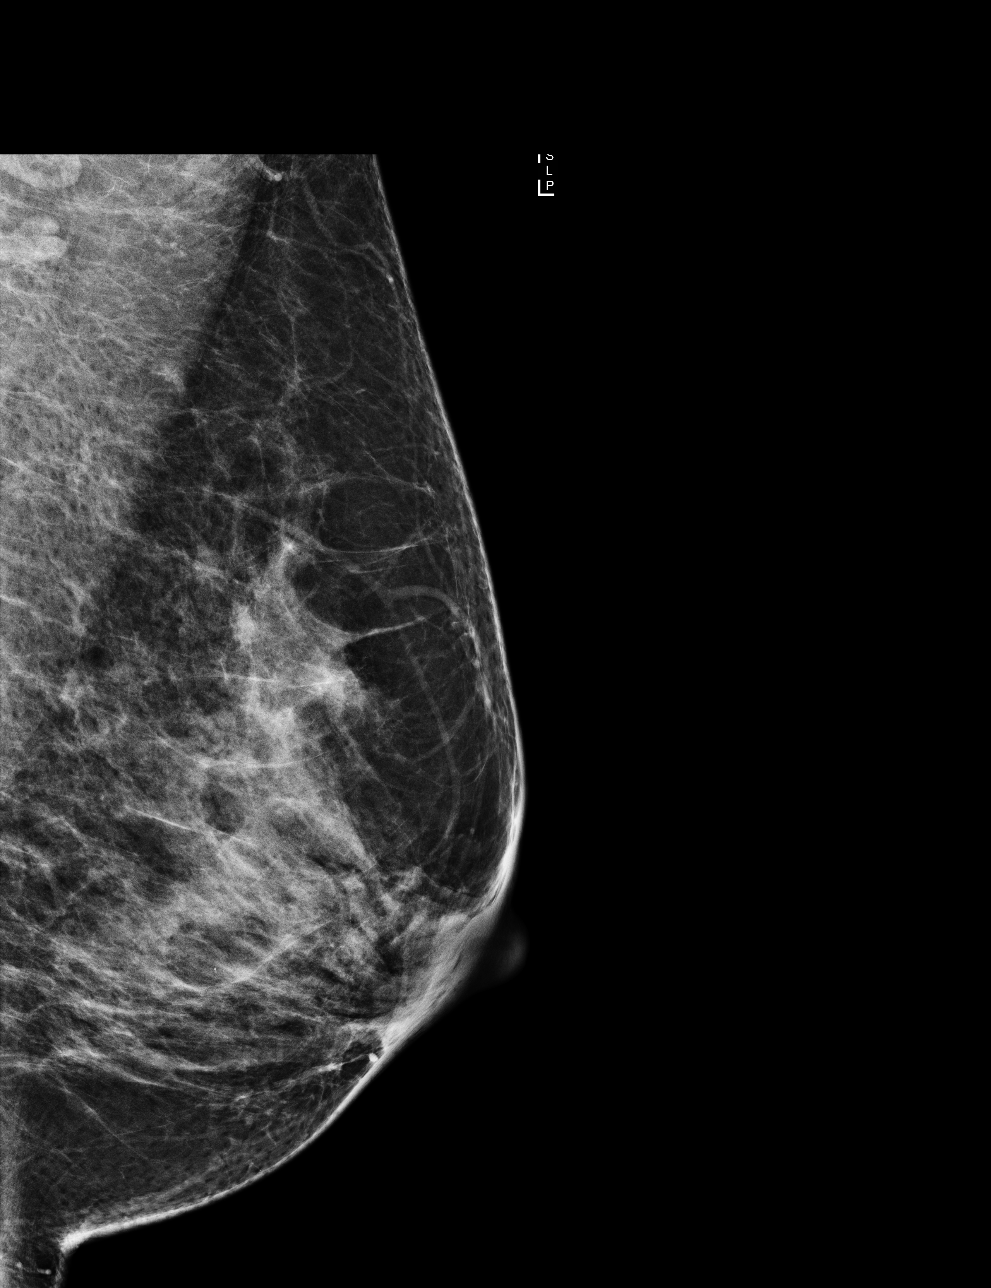

[4 of 4 positions shown; findings below may reference images not displayed]

ACR Breast Density Category c: The breast tissue is heterogeneously
dense, which may obscure small masses.
FINDINGS: There are no findings suspicious for malignancy. Images were
processed with CAD.
IMPRESSION: No mammographic evidence of malignancy. A result letter of this
screening mammogram will be mailed directly to the patient.

RECOMMENDATION:
Screening mammogram in one year. (Code:[id number])

BI-RADS CATEGORY  1: Negative.

## 2020-02-02 ENCOUNTER — Telehealth: Payer: Self-pay

## 2020-02-03 NOTE — Telephone Encounter (Signed)
Patient informed. 

## 2020-02-03 NOTE — Telephone Encounter (Signed)
She needs to call her own PCP about this-

## 2020-02-17 MED FILL — PANTOPRAZOLE SOD DR 40 MG T: 40 | 90 days supply | Qty: 90 | Fill #1

## 2020-03-22 ENCOUNTER — Encounter: Payer: Self-pay | Admitting: Family Medicine

## 2020-03-22 ENCOUNTER — Ambulatory Visit (INDEPENDENT_AMBULATORY_CARE_PROVIDER_SITE_OTHER): Payer: 59 | Admitting: Family Medicine

## 2020-03-22 ENCOUNTER — Other Ambulatory Visit: Payer: Self-pay

## 2020-03-22 ENCOUNTER — Ambulatory Visit: Payer: Self-pay

## 2020-03-22 VITALS — BP 116/72 | HR 74 | Ht 62.0 in | Wt 166.0 lb

## 2020-03-22 DIAGNOSIS — W57XXXA Bitten or stung by nonvenomous insect and other nonvenomous arthropods, initial encounter: Secondary | ICD-10-CM | POA: Diagnosis not present

## 2020-03-22 DIAGNOSIS — M79661 Pain in right lower leg: Secondary | ICD-10-CM

## 2020-03-22 DIAGNOSIS — M79662 Pain in left lower leg: Secondary | ICD-10-CM

## 2020-03-22 DIAGNOSIS — S80861A Insect bite (nonvenomous), right lower leg, initial encounter: Secondary | ICD-10-CM | POA: Diagnosis not present

## 2020-03-22 MED ORDER — AMOXICILLIN-POT CLAVULANATE 875-125 MG PO TABS
1.0000 | ORAL_TABLET | Freq: Two times a day (BID) | ORAL | 0 refills | Status: DC
Start: 2020-03-22 — End: 2020-10-29

## 2020-03-22 MED FILL — AMOX-CLAV 875-125 MG TABLET: 875-125 | 10 days supply | Qty: 20 | Fill #0

## 2020-03-22 NOTE — Progress Notes (Signed)
[place] Phone: [phone] Subjective:    I'm seeing this patient by the request  of:  Minette Brine, FNP  CC: Right calf pain  ELF:[id number]  Jade Owen is a 59 y.o. female coming in with complaint of left calf pain. Indention on lateral calf. No pain in calf but notices indention size changing. Has happened before.   Right leg, lateral calf felt like it was on fire on Saturday morning. Soaked leg in hot water. Leg is cramping at night. History of leg cramps at night.  Has noticed some very mild increase in exacerbation at this time possibly increase in frequency    Past Medical History:  Diagnosis Date  . Acid reflux    No past surgical history on file. Social History   Socioeconomic History  . Marital status: Single    Spouse name: Not on file  . Number of children: Not on file  . Years of education: Not on file  . Highest education level: Not on file  Occupational History  . Not on file  Tobacco Use  . Smoking status: Never Smoker  . Smokeless tobacco: Never Used  Substance and Sexual Activity  . Alcohol use: No  . Drug use: No  . Sexual activity: Yes    Birth control/protection: I.U.D.  Other Topics Concern  . Not on file  Social History Narrative  . Not on file   Social Determinants of Health   Financial Resource Strain:   . Difficulty of Paying Living Expenses:   Food Insecurity:   . Worried About Charity fundraiser in the Last Year:   . Arboriculturist in the Last Year:   Transportation Needs:   . Film/video editor (Medical):   Marland Kitchen Lack of Transportation (Non-Medical):   Physical Activity:   . Days of Exercise per Week:   . Minutes of Exercise per Session:   Stress:   . Feeling of Stress :   Social Connections:   . Frequency of Communication with Friends and Family:   . Frequency of Social Gatherings with Friends and Family:   . Attends Religious Services:   .  Active Member of Clubs or Organizations:   . Attends Archivist Meetings:   Marland Kitchen Marital Status:    No Known Allergies Family History  Problem Relation Age of Onset  . Breast cancer Neg Hx      Current Outpatient Medications (Cardiovascular):  .  metoprolol tartrate (LOPRESSOR) 50 MG tablet, Take 1 tablet (50 mg total) by mouth daily.   Current Outpatient Medications (Analgesics):  .  allopurinol (ZYLOPRIM) 300 MG tablet, Take 1 tablet (300 mg total) by mouth daily. .  colchicine 0.6 MG tablet, Take 2 tablets by mouth at the onset of a flare and 1 tablet an hour later.   Current Outpatient Medications (Other):  .  cyclobenzaprine (FLEXERIL) 5 MG tablet, Take 1 tablet (5 mg total) by mouth as needed for muscle spasms. .  ondansetron (ZOFRAN ODT) 8 MG disintegrating tablet, Take 1 tablet (8 mg total) by mouth every 8 (eight) hours as needed for nausea or vomiting. .  pantoprazole (PROTONIX) 40 MG tablet, Take 1 tablet (40 mg total) by mouth daily. .  Vitamin D, Ergocalciferol, (DRISDOL) 1.25 MG (50000 UNIT) CAPS capsule, Take 1 capsule (50,000 Units total) by mouth 2 (two) times a week. Marland Kitchen  amoxicillin-clavulanate (AUGMENTIN) 875-125 MG tablet, Take 1 tablet by mouth  2 (two) times daily.   Reviewed prior external information including notes and imaging from  primary care provider As well as notes that were available from care everywhere and other healthcare systems.  Past medical history, social, surgical and family history all reviewed in electronic medical record.  No pertanent information unless stated regarding to the chief complaint.   Review of Systems:  No headache, visual changes, nausea, vomiting, diarrhea, constipation, dizziness, abdominal pain, skin rash, fevers, chills, night sweats, weight loss, swollen lymph nodes, body aches, joint swelling, chest pain, shortness of breath, mood changes. POSITIVE muscle aches  Objective  Blood pressure 116/72, pulse 74,  height 5\' 2"  (1.575 m), weight 166 lb (75.3 kg), SpO2 98 %.   General: No apparent distress alert and oriented x3 mood and affect normal, dressed appropriately.  HEENT: Pupils equal, extraocular movements intact  Respiratory: Patient's speak in full sentences and does not appear short of breath  Cardiovascular: No lower extremity edema, non tender, no erythema  Neuro: Cranial nerves II through XII are intact, neurovascularly intact in all extremities with 2+ DTRs and 2+ pulses.  Gait normal with good balance and coordination.  MSK: Patient's right calf is tender to palpation more of the lateral compartment.  Patient does have some skin changes that do appear that patient did have some edema previously.  No pitting on this side.  Contralateral side does have more pitting noted.  Seems to be more tender on the right than the left.  Neurovascularly intact distally.   Limited musculoskeletal ultrasound of patient's right calf shows that patient does have what appears to be mild tracking from the soft tissue with increasing Doppler flow near the muscle.  No swelling between the fascia and the muscle bone noted.  Patient does have an abnormality type seems to be more hypoechoic but is freely compressible.  Difficult to assess if early abscess    Impression and Recommendations:     The above documentation has been reviewed and is accurate and complete Lyndal Pulley, DO       Note: This dictation was prepared with Dragon dictation along with smaller phrase technology. Any transcriptional errors that result from this process are unintentional.

## 2020-03-22 NOTE — Patient Instructions (Signed)
Augumentin 2x a day for 10 days Continue heel lift Compression sleeve Wear wrist brace at night Try to watch sitting position for indent on left side Let me know if pain is not better in 2 weeks and we can see you

## 2020-03-22 NOTE — Assessment & Plan Note (Signed)
Patient had more of an insect bite I believe we will can ultrasound of potentially some mild irritation to the soft tissue and going to the muscle at this point.  I like to put patient on Augmentin to be safe at this point.  Warned of potential side effects of the medication.  Patient should do relatively well.  If continuing to have pain will consider laboratory work-up such as iron, B12 and then potentially such things as lumbar radiculopathy would be in the differential but no back pain at the moment.  Follow-up again in 2 weeks if not improved

## 2020-03-29 ENCOUNTER — Ambulatory Visit: Payer: 59 | Admitting: Family Medicine

## 2020-04-11 ENCOUNTER — Other Ambulatory Visit: Payer: Self-pay

## 2020-04-11 MED ORDER — ALLOPURINOL 300 MG PO TABS: 300.0000 mg | ORAL_TABLET | Freq: Every day | ORAL | 1 refills | Status: DC

## 2020-04-11 MED FILL — ALLOPURINOL 300 MG TABS: 300 | 90 days supply | Qty: 90 | Fill #0

## 2020-04-13 DIAGNOSIS — H52203 Unspecified astigmatism, bilateral: Secondary | ICD-10-CM | POA: Diagnosis not present

## 2020-05-21 ENCOUNTER — Other Ambulatory Visit: Payer: Self-pay

## 2020-05-21 MED ORDER — COLCHICINE 0.6 MG PO TABS: ORAL_TABLET | ORAL | 3 refills | Status: DC

## 2020-05-21 MED FILL — COLCHICINE 0.6 MG TABS: 0.6 | 3 days supply | Qty: 10 | Fill #0

## 2020-06-25 ENCOUNTER — Other Ambulatory Visit: Payer: Self-pay | Admitting: Nurse Practitioner

## 2020-06-25 ENCOUNTER — Encounter: Payer: Self-pay | Admitting: Nurse Practitioner

## 2020-06-25 MED FILL — PANTOPRAZOLE SOD DR 40 MG T: 40 | 90 days supply | Qty: 90 | Fill #0

## 2020-07-16 MED FILL — ALLOPURINOL 300 MG TABS: 300 | 90 days supply | Qty: 90 | Fill #1

## 2020-07-21 ENCOUNTER — Ambulatory Visit: Payer: 59 | Attending: Internal Medicine

## 2020-07-21 DIAGNOSIS — Z23 Encounter for immunization: Secondary | ICD-10-CM

## 2020-07-21 NOTE — Progress Notes (Signed)
   Covid-19 Vaccination Clinic  Name:  Jade Owen    MRN: [id number] DOB: 1960/11/21  07/21/2020  [name] was observed post Covid-19 immunization for 15 minutes without incident. She was provided with Vaccine Information Sheet and instruction to access the V-Safe system.   [name] was instructed to call 911 with any severe reactions post vaccine: Marland Kitchen Difficulty breathing  . Swelling of face and throat  . A fast heartbeat  . A bad rash all over body  . Dizziness and weakness   Immunizations Administered    Name Date Dose VIS Date Route   Pfizer COVID-19 Vaccine 07/21/2020 11:48 AM 0.3 mL 06/06/2020 Intramuscular   Manufacturer: [place]   Lot: [id number]   NDC: [address]-8

## 2020-09-10 ENCOUNTER — Other Ambulatory Visit: Payer: Self-pay | Admitting: Nurse Practitioner

## 2020-09-10 DIAGNOSIS — Z1231 Encounter for screening mammogram for malignant neoplasm of breast: Secondary | ICD-10-CM

## 2020-10-04 MED FILL — COLCHICINE 0.6 MG TABS: 0.6 | 3 days supply | Qty: 10 | Fill #1

## 2020-10-04 MED FILL — PANTOPRAZOLE SOD DR 40 MG T: 40 | 90 days supply | Qty: 90 | Fill #1

## 2020-10-24 ENCOUNTER — Encounter: Payer: 59 | Admitting: Nurse Practitioner

## 2020-10-29 ENCOUNTER — Ambulatory Visit: Payer: 59 | Admitting: Nurse Practitioner

## 2020-10-29 ENCOUNTER — Other Ambulatory Visit: Payer: Self-pay

## 2020-10-29 ENCOUNTER — Ambulatory Visit
Admission: RE | Admit: 2020-10-29 | Discharge: 2020-10-29 | Disposition: A | Discharge status: Home / Self Care | Payer: 59 | Source: Ambulatory Visit | Attending: Nurse Practitioner | Admitting: Nurse Practitioner

## 2020-10-29 ENCOUNTER — Encounter: Payer: Self-pay | Admitting: Nurse Practitioner

## 2020-10-29 ENCOUNTER — Other Ambulatory Visit: Payer: Self-pay | Admitting: Nurse Practitioner

## 2020-10-29 VITALS — BP 120/70 | HR 75 | Temp 98.5°F | Ht 62.0 in | Wt 165.0 lb

## 2020-10-29 DIAGNOSIS — Z1231 Encounter for screening mammogram for malignant neoplasm of breast: Secondary | ICD-10-CM | POA: Diagnosis not present

## 2020-10-29 DIAGNOSIS — E7800 Pure hypercholesterolemia, unspecified: Secondary | ICD-10-CM | POA: Diagnosis not present

## 2020-10-29 DIAGNOSIS — E559 Vitamin D deficiency, unspecified: Secondary | ICD-10-CM

## 2020-10-29 DIAGNOSIS — R002 Palpitations: Secondary | ICD-10-CM

## 2020-10-29 DIAGNOSIS — Z0000 Encounter for general adult medical examination without abnormal findings: Secondary | ICD-10-CM

## 2020-10-29 DIAGNOSIS — Z0001 Encounter for general adult medical examination with abnormal findings: Secondary | ICD-10-CM | POA: Diagnosis not present

## 2020-10-29 MED ORDER — METOPROLOL TARTRATE 50 MG PO TABS: 50.0000 mg | ORAL_TABLET | Freq: Every day | ORAL | 1 refills | Status: DC

## 2020-10-29 NOTE — Progress Notes (Signed)
I,Yamilka Roman [employer] as a Education administrator for [employer], FNP.,have documented all relevant documentation on the behalf of Minette Brine, FNP,as directed by  Minette Brine, FNP while in the presence of Minette Brine, [place]. This visit occurred during the SARS-CoV-2 public health emergency.  Safety protocols were in place, including screening questions prior to the visit, additional usage of staff PPE, and extensive cleaning of exam room while observing appropriate contact time as indicated for disinfecting solutions.  Subjective:     Patient ID: Jade Owen , female    DOB: [birth year] , 60 y.o.   MRN: [id number]   Chief Complaint  Patient presents with  . Annual Exam    HPI  Patient here for hm. She has a torn ligament and her sports medicine provider does not want her to walk on a treadmill.  She is working from 8-5 and then [employer] on weekends.  She will have palpitations when her acid reflux or hiatal hernia "acts up".    Wt Readings from Last 3 Encounters: 10/29/20 : 165 lb (74.8 kg) 03/22/20 : 166 lb (75.3 kg) 10/24/19 : 166 lb 9.6 oz (75.6 kg)      Past Medical History:  Diagnosis Date  . Acid reflux      Family History  Problem Relation Age of Onset  . Breast cancer Neg Hx      Current Outpatient Medications:  .  allopurinol (ZYLOPRIM) 300 MG tablet, Take 1 tablet (300 mg total) by mouth daily., Disp: 90 tablet, Rfl: 1 .  colchicine 0.6 MG tablet, Take 2 tablets by mouth at the onset of a flare and 1 tablet an hour later., Disp: 10 tablet, Rfl: 3 .  cyclobenzaprine (FLEXERIL) 5 MG tablet, Take 1 tablet (5 mg total) by mouth as needed for muscle spasms., Disp: 20 tablet, Rfl: 0 .  ondansetron (ZOFRAN ODT) 8 MG disintegrating tablet, Take 1 tablet (8 mg total) by mouth every 8 (eight) hours as needed for nausea or vomiting., Disp: 30 tablet, Rfl: 0 .  pantoprazole (PROTONIX) 40 MG tablet, TAKE 1 TABLET (40 MG TOTAL) BY MOUTH DAILY., Disp: 90 tablet, Rfl: 1 .   metoprolol tartrate (LOPRESSOR) 50 MG tablet, Take 1 tablet (50 mg total) by mouth daily., Disp: 90 tablet, Rfl: 1 .  Vitamin D, Ergocalciferol, (DRISDOL) 1.25 MG (50000 UNIT) CAPS capsule, Take 1 capsule (50,000 Units total) by mouth 2 (two) times a week. (Patient not taking: Reported on 10/29/2020), Disp: 24 capsule, Rfl: 1   No Known Allergies    The patient states she uses IUD for birth control.  No LMP recorded. (Menstrual status: IUD).. Negative for Dysmenorrhea and Negative for Menorrhagia. Negative for: breast discharge, breast lump(s), breast pain and breast self exam. Associated symptoms include abnormal vaginal bleeding. Pertinent negatives include abnormal bleeding (hematology), anxiety, decreased libido, depression, difficulty falling sleep, dyspareunia, history of infertility, nocturia, sexual dysfunction, sleep disturbances, urinary incontinence, urinary urgency, vaginal discharge and vaginal itching. Diet regular.  The patient states her exercise level is none.     The patient's tobacco use is:  Social History   Tobacco Use  Smoking Status Never Smoker  Smokeless Tobacco Never Used   She has been exposed to passive smoke. The patient's alcohol use is:  Social History   Substance and Sexual Activity  Alcohol Use No   Additional information: Last pap unknown, next one scheduled for this year she is planning to schedule with Dr. Nyoka Cowden    Review of Systems  Constitutional:  Negative.   HENT: Negative.   Eyes: Negative.   Respiratory: Negative.   Cardiovascular: Positive for palpitations. Negative for chest pain and leg swelling.  Gastrointestinal: Negative.   Endocrine: Negative.   Genitourinary: Negative.   Musculoskeletal: Negative.   Skin: Negative.   Allergic/Immunologic: Negative.   Neurological: Negative.   Hematological: Negative.   Psychiatric/Behavioral: Negative.      Today's Vitals   10/29/20 0836  BP: 120/70  Pulse: 75  Temp: 98.5 F (36.9 C)   TempSrc: Oral  Weight: 165 lb (74.8 kg)  Height: 5' 2"  (1.575 m)  PainSc: 0-No pain   Body mass index is 30.18 kg/m.   Objective:  Physical Exam Constitutional:      General: She is not in acute distress.    Appearance: Normal appearance. She is well-developed and normal weight.  HENT:     Head: Normocephalic and atraumatic.     Right Ear: Hearing, tympanic membrane, ear canal and external ear normal. There is no impacted cerumen.     Left Ear: Hearing, tympanic membrane, ear canal and external ear normal. There is no impacted cerumen.     Nose:     Comments: Deferred - masked    Mouth/Throat:     Comments: Deferred - masked Eyes:     General: Lids are normal.     Extraocular Movements: Extraocular movements intact.     Conjunctiva/sclera: Conjunctivae normal.     Pupils: Pupils are equal, round, and reactive to light.     Funduscopic exam:    Right eye: No papilledema.        Left eye: No papilledema.  Neck:     Thyroid: No thyroid mass.     Vascular: No carotid bruit.  Cardiovascular:     Rate and Rhythm: Normal rate and regular rhythm.     Pulses: Normal pulses.     Heart sounds: Normal heart sounds. No murmur heard.   Pulmonary:     Effort: Pulmonary effort is normal.     Breath sounds: Normal breath sounds.  Chest:     Chest wall: No mass.  Breasts:     Tanner Score is 5.     Right: Normal. No mass, tenderness, axillary adenopathy or supraclavicular adenopathy.     Left: Normal. No mass, tenderness, axillary adenopathy or supraclavicular adenopathy.    Abdominal:     General: Abdomen is flat. Bowel sounds are normal. There is no distension.     Palpations: Abdomen is soft.     Tenderness: There is no abdominal tenderness.  Genitourinary:    Rectum: Guaiac result negative.  Musculoskeletal:        General: No swelling. Normal range of motion.     Cervical back: Full passive range of motion without pain, normal range of motion and neck supple.     Right  lower leg: No edema.     Left lower leg: No edema.  Lymphadenopathy:     Upper Body:     Right upper body: No supraclavicular, axillary or pectoral adenopathy.     Left upper body: No supraclavicular, axillary or pectoral adenopathy.  Skin:    General: Skin is warm and dry.     Capillary Refill: Capillary refill takes less than 2 seconds.  Neurological:     General: No focal deficit present.     Mental Status: She is alert and oriented to person, place, and time.     Cranial Nerves: No cranial nerve deficit.  Sensory: No sensory deficit.  Psychiatric:        Mood and Affect: Mood normal.        Behavior: Behavior normal.        Thought Content: Thought content normal.        Judgment: Judgment normal.         Assessment And Plan:     1. Encounter for general adult medical examination w/o abnormal findings . Behavior modifications discussed and diet history reviewed.   . Pt will continue to exercise regularly and modify diet with low GI, plant based foods and decrease intake of processed foods.  . Recommend intake of daily multivitamin, Vitamin D, and calcium.  . Recommend mammogram and colonoscopy (both are up to date) for preventive screenings, as well as recommend immunizations that include influenza, TDAP (up to date) . She is planning to schedule an appt with Dr. Nyoka Cowden GYN for her PAP - CMP14+EGFR - CBC  2. Vitamin D deficiency  Will check vitamin D level and supplement as needed.     Also encouraged to spend 15 minutes in the sun daily.  - Vitamin D (25 hydroxy)  3. Elevated cholesterol  Elevated more at last visit, will recheck lipid panel  Encouraged to eat a low fat diet and take a fiber supplement  No current medications - Lipid panel  4. Palpitations  This has been ongoing and she had been followed by a Cardiology in the past but it has been several years and she does not remember the providers name  EKG done with NSR - HR 69  She has also not been  taking her metoprolol regularly. - Ambulatory referral to Cardiology - EKG 12-Lead - metoprolol tartrate (LOPRESSOR) 50 MG tablet; Take 1 tablet (50 mg total) by mouth daily.  Dispense: 90 tablet; Refill: 1     Patient was given opportunity to ask questions. Patient verbalized understanding of the plan and was able to repeat key elements of the plan. All questions were answered to their satisfaction.   Minette Brine, FNP   I, Minette Brine, FNP, have reviewed all documentation for this visit. The documentation on 10/29/20 for the exam, diagnosis, procedures, and orders are all accurate and complete.   THE PATIENT IS ENCOURAGED TO PRACTICE SOCIAL DISTANCING DUE TO THE COVID-19 PANDEMIC.

## 2020-10-29 NOTE — Patient Instructions (Signed)
Health Maintenance, Female Adopting a healthy lifestyle and getting preventive care are important in promoting health and wellness. Ask your health care provider about:  The right schedule for you to have regular tests and exams.  Things you can do on your own to prevent diseases and keep yourself healthy. What should I know about diet, weight, and exercise? Eat a healthy diet  Eat a diet that includes plenty of vegetables, fruits, low-fat dairy products, and lean protein.  Do not eat a lot of foods that are high in solid fats, added sugars, or sodium.   Maintain a healthy weight Body mass index (BMI) is used to identify weight problems. It estimates body fat based on height and weight. Your health care provider can help determine your BMI and help you achieve or maintain a healthy weight. Get regular exercise Get regular exercise. This is one of the most important things you can do for your health. Most adults should:  Exercise for at least 150 minutes each week. The exercise should increase your heart rate and make you sweat (moderate-intensity exercise).  Do strengthening exercises at least twice a week. This is in addition to the moderate-intensity exercise.  Spend less time sitting. Even light physical activity can be beneficial. Watch cholesterol and blood lipids Have your blood tested for lipids and cholesterol at 59 years of age, then have this test every 5 years. Have your cholesterol levels checked more often if:  Your lipid or cholesterol levels are high.  You are older than 59 years of age.  You are at high risk for heart disease. What should I know about cancer screening? Depending on your health history and family history, you may need to have cancer screening at various ages. This may include screening for:  Breast cancer.  Cervical cancer.  Colorectal cancer.  Skin cancer.  Lung cancer. What should I know about heart disease, diabetes, and high blood  pressure? Blood pressure and heart disease  High blood pressure causes heart disease and increases the risk of stroke. This is more likely to develop in people who have high blood pressure readings, are of African descent, or are overweight.  Have your blood pressure checked: ? Every 3-5 years if you are 18-39 years of age. ? Every year if you are 40 years old or older. Diabetes Have regular diabetes screenings. This checks your fasting blood sugar level. Have the screening done:  Once every three years after age 40 if you are at a normal weight and have a low risk for diabetes.  More often and at a younger age if you are overweight or have a high risk for diabetes. What should I know about preventing infection? Hepatitis B If you have a higher risk for hepatitis B, you should be screened for this virus. Talk with your health care provider to find out if you are at risk for hepatitis B infection. Hepatitis C Testing is recommended for:  Everyone born from 1945 through 1965.  Anyone with known risk factors for hepatitis C. Sexually transmitted infections (STIs)  Get screened for STIs, including gonorrhea and chlamydia, if: ? You are sexually active and are younger than 59 years of age. ? You are older than 59 years of age and your health care provider tells you that you are at risk for this type of infection. ? Your sexual activity has changed since you were last screened, and you are at increased risk for chlamydia or gonorrhea. Ask your health care provider   if you are at risk.  Ask your health care provider about whether you are at high risk for HIV. Your health care provider may recommend a prescription medicine to help prevent HIV infection. If you choose to take medicine to prevent HIV, you should first get tested for HIV. You should then be tested every 3 months for as long as you are taking the medicine. Pregnancy  If you are about to stop having your period (premenopausal) and  you may become pregnant, seek counseling before you get pregnant.  Take 400 to 800 micrograms (mcg) of folic acid every day if you become pregnant.  Ask for birth control (contraception) if you want to prevent pregnancy. Osteoporosis and menopause Osteoporosis is a disease in which the bones lose minerals and strength with aging. This can result in bone fractures. If you are 65 years old or older, or if you are at risk for osteoporosis and fractures, ask your health care provider if you should:  Be screened for bone loss.  Take a calcium or vitamin D supplement to lower your risk of fractures.  Be given hormone replacement therapy (HRT) to treat symptoms of menopause. Follow these instructions at home: Lifestyle  Do not use any products that contain nicotine or tobacco, such as cigarettes, e-cigarettes, and chewing tobacco. If you need help quitting, ask your health care provider.  Do not use street drugs.  Do not share needles.  Ask your health care provider for help if you need support or information about quitting drugs. Alcohol use  Do not drink alcohol if: ? Your health care provider tells you not to drink. ? You are pregnant, may be pregnant, or are planning to become pregnant.  If you drink alcohol: ? Limit how much you use to 0-1 drink a day. ? Limit intake if you are breastfeeding.  Be aware of how much alcohol is in your drink. In the [place] one drink equals one 12 oz bottle of beer (355 mL), one 5 oz glass of wine (148 mL), or one 1 oz glass of hard liquor (44 mL). General instructions  Schedule regular health, dental, and eye exams.  Stay current with your vaccines.  Tell your health care provider if: ? You often feel depressed. ? You have ever been abused or do not feel safe at home. Summary  Adopting a healthy lifestyle and getting preventive care are important in promoting health and wellness.  Follow your health care provider's instructions about healthy  diet, exercising, and getting tested or screened for diseases.  Follow your health care provider's instructions on monitoring your cholesterol and blood pressure. This information is not intended to replace advice given to you by your health care provider. Make sure you discuss any questions you have with your health care provider. Document Revised: 07/28/2018 Document Reviewed: 07/28/2018 Elsevier Patient Education  2021 Elsevier Inc.  

## 2020-10-30 ENCOUNTER — Other Ambulatory Visit: Payer: Self-pay | Admitting: Nurse Practitioner

## 2020-10-30 DIAGNOSIS — E559 Vitamin D deficiency, unspecified: Secondary | ICD-10-CM

## 2020-10-30 DIAGNOSIS — E782 Mixed hyperlipidemia: Secondary | ICD-10-CM

## 2020-10-30 LAB — CMP14+EGFR
ALT: 20 IU/L (ref 0–32)
AST: 19 IU/L (ref 0–40)
Albumin/Globulin Ratio: 1.7 (ref 1.2–2.2)
Albumin: 3.8 g/dL (ref 3.8–4.9)
Alkaline Phosphatase: 53 IU/L (ref 44–121)
BUN/Creatinine Ratio: 17 (ref 9–23)
BUN: 12 mg/dL (ref 6–24)
Bilirubin Total: 0.4 mg/dL (ref 0.0–1.2)
CO2: 22 mmol/L (ref 20–29)
Calcium: 9.3 mg/dL (ref 8.7–10.2)
Chloride: 104 mmol/L (ref 96–106)
Creatinine, Ser: 0.72 mg/dL (ref 0.57–1.00)
Globulin, Total: 2.3 g/dL (ref 1.5–4.5)
Glucose: 95 mg/dL (ref 65–99)
Potassium: 4.4 mmol/L (ref 3.5–5.2)
Sodium: 140 mmol/L (ref 134–144)
Total Protein: 6.1 g/dL (ref 6.0–8.5)
eGFR: 97 mL/min/{1.73_m2} (ref >59)

## 2020-10-30 LAB — CBC
Hematocrit: 41.4 % (ref 34.0–46.6)
Hemoglobin: 13.6 g/dL (ref 11.1–15.9)
MCH: 29.4 pg (ref 26.6–33.0)
MCHC: 32.9 g/dL (ref 31.5–35.7)
MCV: 89 fL (ref 79–97)
Platelets: 253 10*3/uL (ref 150–450)
RBC: 4.63 x10E6/uL (ref 3.77–5.28)
RDW: 13.1 % (ref 11.7–15.4)
WBC: 6.3 10*3/uL (ref 3.4–10.8)

## 2020-10-30 LAB — LIPID PANEL
Chol/HDL Ratio: 3.9 ratio (ref 0.0–4.4)
Cholesterol, Total: 233 mg/dL — ABNORMAL HIGH (ref 100–199)
HDL: 59 mg/dL (ref >39)
LDL Chol Calc (NIH): 160 mg/dL — ABNORMAL HIGH (ref 0–99)
Triglycerides: 82 mg/dL (ref 0–149)
VLDL Cholesterol Cal: 14 mg/dL (ref 5–40)

## 2020-10-30 LAB — VITAMIN D 25 HYDROXY (VIT D DEFICIENCY, FRACTURES): Vit D, 25-Hydroxy: 15.4 ng/mL — ABNORMAL LOW (ref 30.0–100.0)

## 2020-10-30 MED ORDER — VITAMIN D (ERGOCALCIFEROL) 1.25 MG (50000 UNIT) PO CAPS: 50000.0000 [IU] | ORAL_CAPSULE | ORAL | 1 refills | Status: DC

## 2020-10-30 MED ORDER — ATORVASTATIN CALCIUM 10 MG PO TABS: 10.0000 mg | ORAL_TABLET | Freq: Every day | ORAL | 3 refills | Status: DC

## 2020-11-05 ENCOUNTER — Other Ambulatory Visit: Payer: Self-pay

## 2020-11-05 MED ORDER — ALLOPURINOL 300 MG PO TABS: 300.0000 mg | ORAL_TABLET | Freq: Every day | ORAL | 1 refills | Status: DC

## 2020-11-05 MED FILL — ALLOPURINOL 300 MG TABS: 300 | 90 days supply | Qty: 90 | Fill #0

## 2020-11-12 MED FILL — COLCHICINE 0.6 MG TABS: 0.6 | 3 days supply | Qty: 10 | Fill #2

## 2020-12-11 ENCOUNTER — Ambulatory Visit: Payer: Self-pay | Admitting: Nurse Practitioner

## 2021-01-27 IMAGING — MG DIGITAL SCREENING BILAT W/ TOMO W/ CAD
8 series · 9 of 24 positions shown · non-contrast
Comparison: Previous exam(s).

CLINICAL DATA: Screening.

EXAM:
DIGITAL SCREENING BILATERAL MAMMOGRAM WITH TOMO AND CAD

[L CC synth-2D]
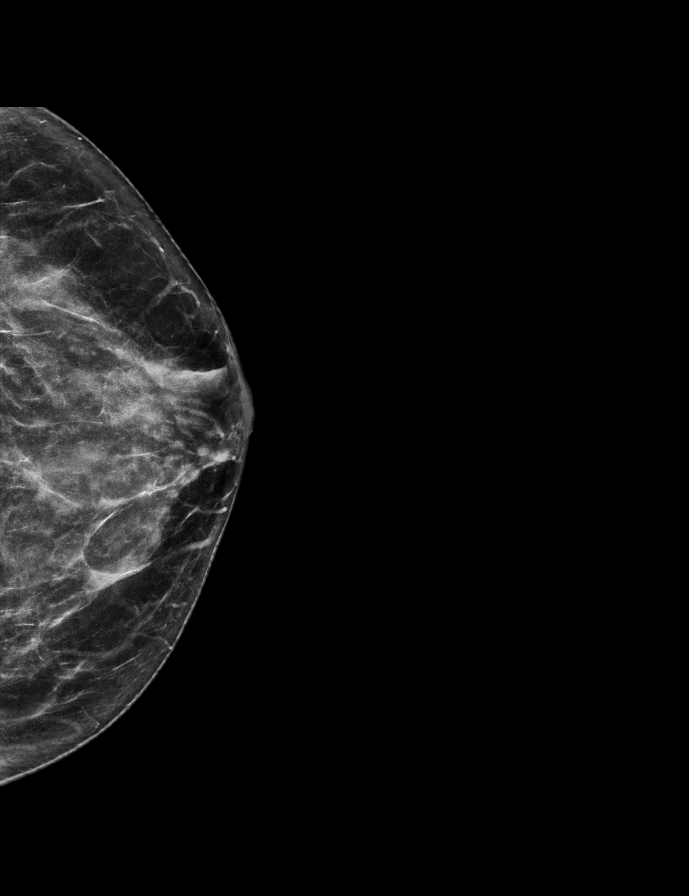

[R MLO synth-2D]
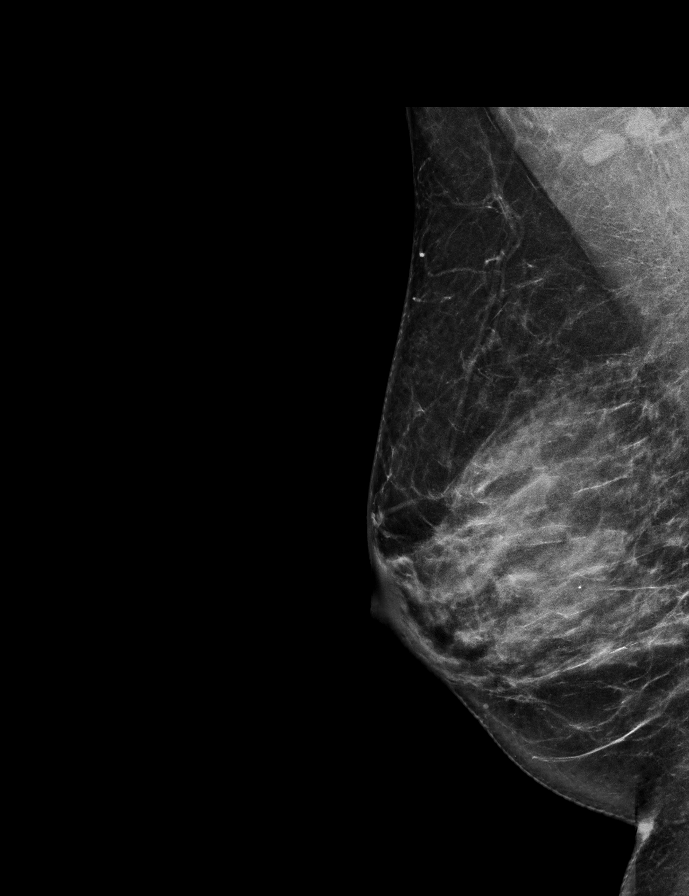

[L MLO synth-2D]
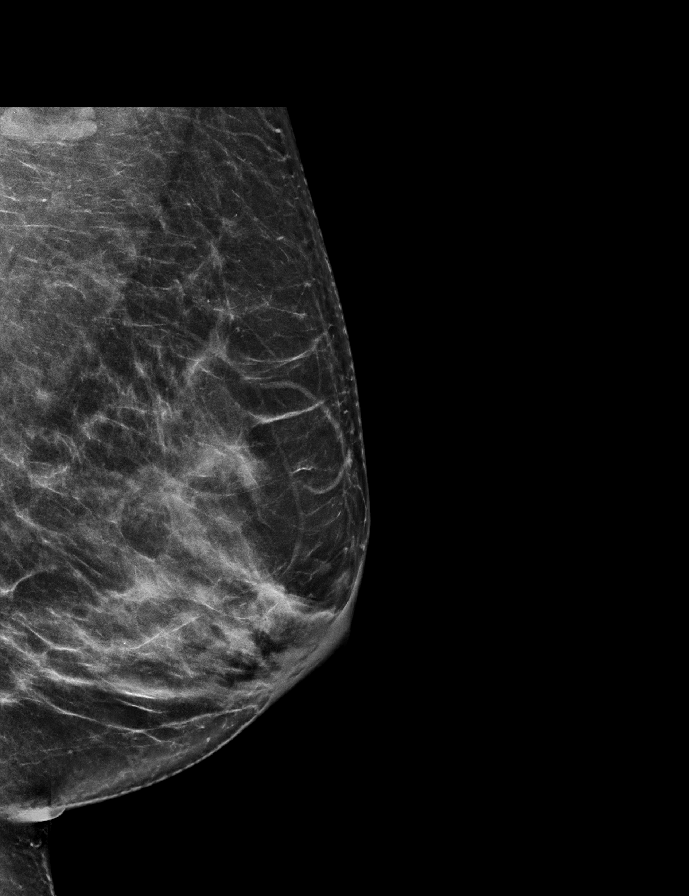

[R CC synth-2D]
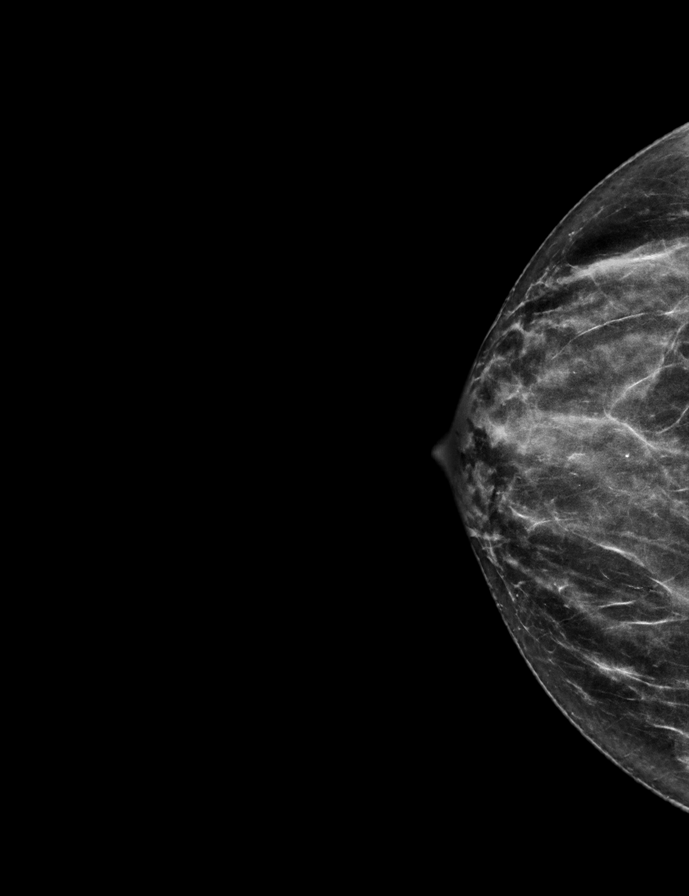

[L MLO tomo · 2 of 66 frames shown]
[frame 22/66]
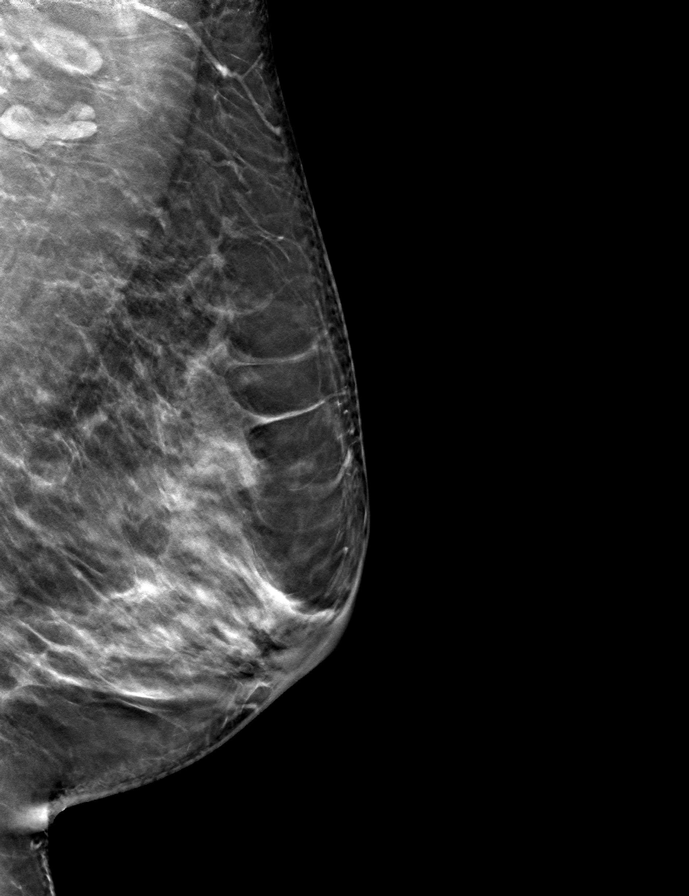
[frame 33/66]
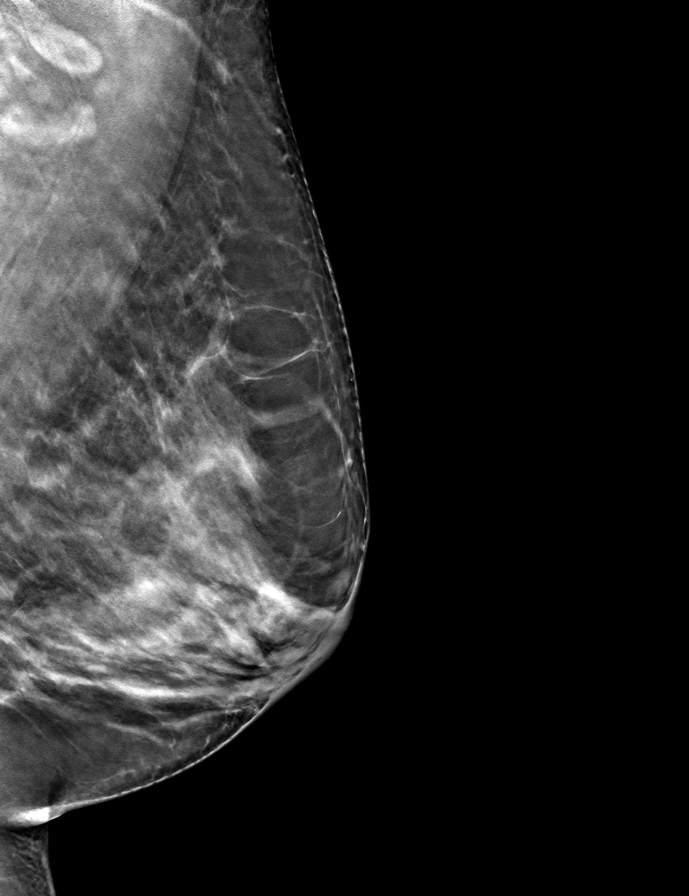

[R MLO tomo · tomo slice 34/67.0]
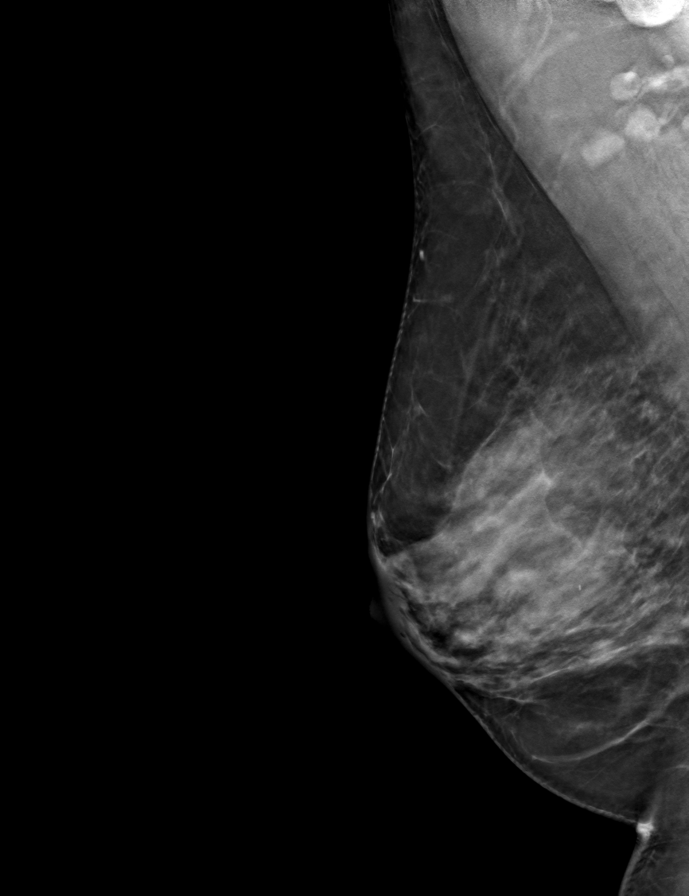

[R CC tomo · tomo slice 31/60.0]
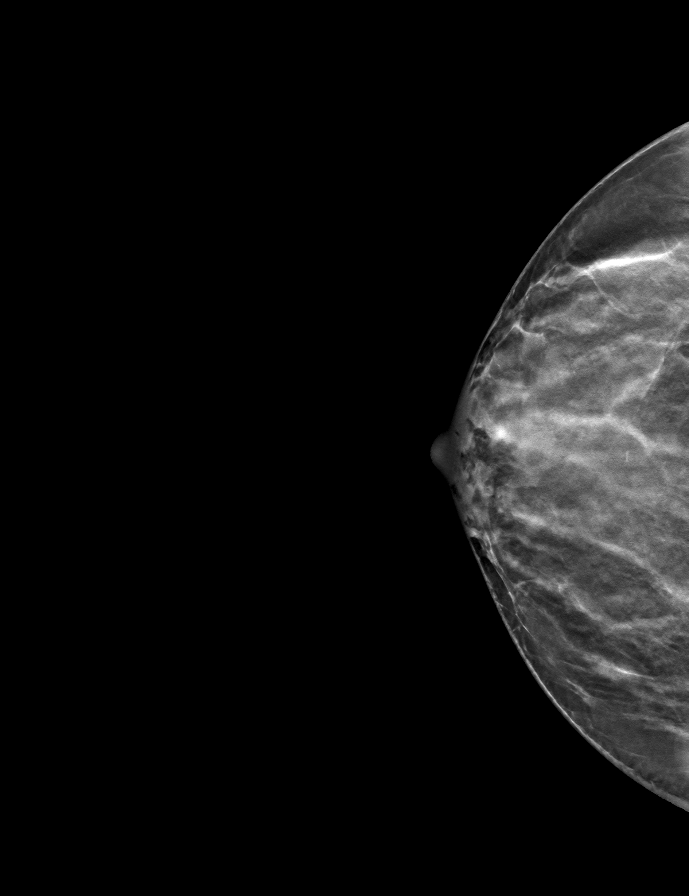

[L CC tomo · tomo slice 31/60.0]
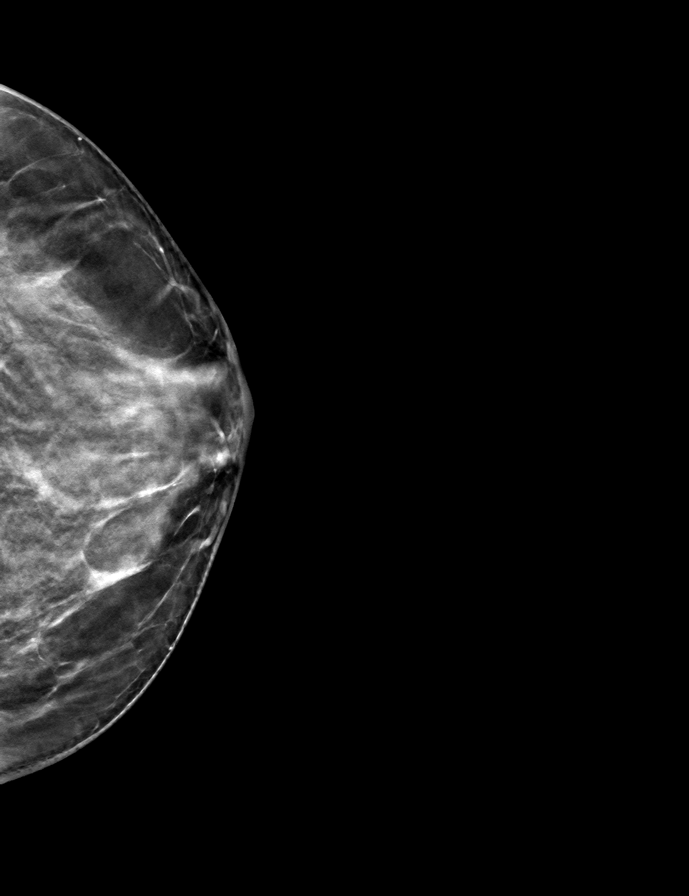

[9 of 24 positions shown; findings below may reference images not displayed]

ACR Breast Density Category c: The breast tissue is heterogeneously
dense, which may obscure small masses.
FINDINGS: There are no findings suspicious for malignancy. Images were
processed with CAD.
IMPRESSION: No mammographic evidence of malignancy. A result letter of this
screening mammogram will be mailed directly to the patient.

RECOMMENDATION:
Screening mammogram in one year. (Code:[id number])

BI-RADS CATEGORY  1: Negative.

## 2021-02-04 ENCOUNTER — Other Ambulatory Visit: Payer: Self-pay | Admitting: Nurse Practitioner

## 2021-02-04 ENCOUNTER — Other Ambulatory Visit (HOSPITAL_COMMUNITY): Payer: Self-pay

## 2021-02-04 MED ORDER — PANTOPRAZOLE SODIUM 40 MG PO TBEC
DELAYED_RELEASE_TABLET | Freq: Every day | ORAL | 1 refills | Status: DC
  Filled 2021-02-04: qty 90, 90d supply, fill #0
  Filled 2021-05-17: qty 90, 90d supply, fill #1

## 2021-02-04 MED FILL — Allopurinol Tab 300 MG: ORAL | 90 days supply | Qty: 90 | Fill #0 | Status: AC

## 2021-02-04 MED FILL — Atorvastatin Calcium Tab 10 MG (Base Equivalent): ORAL | 30 days supply | Qty: 30 | Fill #0 | Status: AC

## 2021-02-04 MED FILL — Ergocalciferol Cap 1.25 MG (50000 Unit): ORAL | 84 days supply | Qty: 24 | Fill #0 | Status: AC

## 2021-02-25 DIAGNOSIS — B977 Papillomavirus as the cause of diseases classified elsewhere: Secondary | ICD-10-CM | POA: Insufficient documentation

## 2021-02-25 DIAGNOSIS — N83209 Unspecified ovarian cyst, unspecified side: Secondary | ICD-10-CM | POA: Insufficient documentation

## 2021-02-25 DIAGNOSIS — R87610 Atypical squamous cells of undetermined significance on cytologic smear of cervix (ASC-US): Secondary | ICD-10-CM | POA: Insufficient documentation

## 2021-02-25 DIAGNOSIS — N72 Inflammatory disease of cervix uteri: Secondary | ICD-10-CM | POA: Insufficient documentation

## 2021-02-27 ENCOUNTER — Ambulatory Visit (INDEPENDENT_AMBULATORY_CARE_PROVIDER_SITE_OTHER): Payer: 59 | Admitting: Cardiovascular Disease

## 2021-02-27 ENCOUNTER — Encounter (HOSPITAL_BASED_OUTPATIENT_CLINIC_OR_DEPARTMENT_OTHER): Payer: Self-pay | Admitting: Cardiovascular Disease

## 2021-02-27 ENCOUNTER — Other Ambulatory Visit: Payer: Self-pay

## 2021-02-27 ENCOUNTER — Ambulatory Visit (INDEPENDENT_AMBULATORY_CARE_PROVIDER_SITE_OTHER): Payer: 59

## 2021-02-27 DIAGNOSIS — R079 Chest pain, unspecified: Secondary | ICD-10-CM

## 2021-02-27 DIAGNOSIS — E7800 Pure hypercholesterolemia, unspecified: Secondary | ICD-10-CM

## 2021-02-27 DIAGNOSIS — R002 Palpitations: Secondary | ICD-10-CM

## 2021-02-27 HISTORY — DX: Chest pain, unspecified: R07.9

## 2021-02-27 NOTE — Assessment & Plan Note (Addendum)
Patient reports palpitations lasting 15 to 20 minutes weekly.  We will get a 14-day ZIO monitor.  We will also check a comprehensive metabolic panel, CBC, TSH, and magnesium.  We discussed the fact that taking metoprolol tartrate once daily is somewhat odd.  She has not been taking it regularly.  We will reassess once we get her monitor back.

## 2021-02-27 NOTE — Assessment & Plan Note (Signed)
She has exertional chest pain and dyspnea.  She also has a family history of several family members with CAD.  We will get a coronary CT-A to assess.  Continue atorvastatin.

## 2021-02-27 NOTE — Patient Instructions (Addendum)
Medication Instructions:  TAKE METOPROLOL 50 MG 2 TABLETS 2 HOURS PRIOR TO YOUR CARDIAC CT   *If you need a refill on your cardiac medications before your next appointment, please call your pharmacy*  Lab Work: FASTING CBC/MAGANESIUM/LIPID/CMET/TSH/FT4 A FEW DAYS PRIOR TO YOUR CARDIAC CT  If you have labs (blood work) drawn today and your tests are completely normal, you will receive your results only by: [place] (if you have MyChart) OR A paper copy in the mail If you have any lab test that is abnormal or we need to change your treatment, we will call you to review the results.  Testing/Procedures: Your physician has requested that you have cardiac CT. Cardiac computed tomography (CT) is a painless test that uses an x-ray machine to take clear, detailed pictures of your heart. For further information please visit [email or web address]. Please follow instruction sheet as given. ONCE INSURANCE HAS BEEN REVIEWED THE OFFICE WILL CALL YOU TO SCHEDULE IF YOU DO NOT HEAR ANYTHING FOR 2 WEEKS CALL THE OFFICE TO FOLLOW UP   ZIO MONITOR-14 DAY PLACED IN OFFICE TODAY   Follow-Up: At [facility], you and your health needs are our priority.  As part of our continuing mission to provide you with exceptional heart care, we have created designated Provider Care Teams.  These Care Teams include your primary Cardiologist (physician) and Advanced Practice Providers (APPs -  Physician Assistants and Nurse Practitioners) who all work together to provide you with the care you need, when you need it.  We recommend signing up for the patient portal called "MyChart".  Sign up information is provided on this After Visit Summary.  MyChart is used to connect with patients for Virtual Visits (Telemedicine).  Patients are able to view lab/test results, encounter notes, upcoming appointments, etc.  Non-urgent messages can be sent to your provider as well.   To learn more about what you can do with MyChart, go to  [email or web address].    Your next appointment:   12 month(s)  The format for your next appointment:   In Person  Provider:   Skeet Latch, MD  Your physician recommends that you schedule a follow-up appointment in [place] [facility]   Other Instructions    Your cardiac CT will be scheduled at one of the below locations:   [facility] [address] [place] [address] [phone]  [place] [address] [place], [place] [address] [phone]  If scheduled at [facility], please arrive at the [facility] main entrance (entrance A) of [facility] 30 minutes prior to test start time. Proceed to the [facility] Radiology Department (first floor) to check-in and test prep.  If scheduled at [facility], please arrive 15 mins early for check-in and test prep.  Please follow these instructions carefully (unless otherwise directed):  On the Night Before the Test: Be sure to Drink plenty of water. Do not consume any caffeinated/decaffeinated beverages or chocolate 12 hours prior to your test. Do not take any antihistamines 12 hours prior to your test. If the patient has contrast allergy: Patient will need a prescription for Prednisone and very clear instructions (as follows): Prednisone 50 mg - take 13 hours prior to test Take another Prednisone 50 mg 7 hours prior to test Take another Prednisone 50 mg 1 hour prior to test Take Benadryl 50 mg 1 hour prior to test Patient must complete all four  doses of above prophylactic medications. Patient will need a ride after test due to Benadryl.  On the Day of the Test: Drink plenty of water until 1 hour prior to the test. Do not eat any food 4 hours prior to the test. You may take your regular medications prior to the test.  Take metoprolol (Lopressor) two hours prior to test. HOLD  Furosemide/Hydrochlorothiazide morning of the test. FEMALES- please wear underwire-free bra if available, avoid dresses & tight clothing      After the Test: Drink plenty of water. After receiving IV contrast, you may experience a mild flushed feeling. This is normal. On occasion, you may experience a mild rash up to 24 hours after the test. This is not dangerous. If this occurs, you can take Benadryl 25 mg and increase your fluid intake. If you experience trouble breathing, this can be serious. If it is severe call 911 IMMEDIATELY. If it is mild, please call our office. If you take any of these medications: Glipizide/Metformin, Avandament, Glucavance, please do not take 48 hours after completing test unless otherwise instructed.   Once we have confirmed authorization from your insurance company, we will call you to set up a date and time for your test. Based on how quickly your insurance processes prior authorizations requests, please allow up to 4 weeks to be contacted for scheduling your Cardiac CT appointment. Be advised that routine Cardiac CT appointments could be scheduled as many as 8 weeks after your provider has ordered it.  For non-scheduling related questions, please contact the cardiac imaging nurse navigator should you have any questions/concerns: [name], Cardiac Imaging Nurse Navigator Gordy Clement, Cardiac Imaging Nurse Navigator [facility] Direct Office Dial: [phone]   For scheduling needs, including cancellations and rescheduling, please call [place], [phone].

## 2021-02-27 NOTE — Assessment & Plan Note (Signed)
Lipids are poorly controlled.  She was appropriately started on atorvastatin in March.  Repeat lipids and a CMP and continue atorvastatin.

## 2021-02-27 NOTE — Progress Notes (Signed)
Cardiology Office Note:    Date:  02/27/2021   ID:  Jade Owen, DOB 25-Jul-1961, MRN [id number]  PCP:  Minette Brine, [place] Providers Cardiologist:  None     Referring MD: Minette Brine, [place]   Chief Complaint  Patient presents with   New Patient (Initial Visit)   Edema    Ankles.   Chest Pain    History of Present Illness:    Jade Owen is a 60 y.o. female with a hx of acid reflux, and hyperlipidemia here today for the initial evaluation of palpitations. She last saw Minette Brine, FNP on 10/29/2020. At that visit, her palpitations had been ongoing, and she had not been taking her 50 mg metoprolol regularly. EKG on 10/29/2020 showed NSR with HR 69. Ms. Lantigua was followed by cardiology in the past but not presently. She was referred to cardiology.  Today, she reports that she was also referred because her family has a history of heart attacks. Previously, she was having chest pains that were found to be related to acid reflux and a hiatal hernia. She continues to have these pains occasionally, but they have been infrequent in the past few months. Her palpitations have also been occurring for a long time, but not in the last few months. When they do occur, she feels like "her heart is beating through her shirt." Lately, her palpitations are occurring about once a week. She will typically sit down and wait for her episode to resolve. When her heart races, it will last for 10-15 minutes and correlate with shortness of breath and hot flashes. These episodes can happen at rest while sitting and with exertion. When she takes metoprolol, this does lower her heart rate. Also, at one time she was very lightheaded and faint, but she did not have a syncopal episode. She believes this was caused by dehydration. For exercise, she is trying to get back to using a treadmill and other equipment at home. Previously she exercised 5 days a week but stopped when she was feeling better. She  does not drink alcohol and has never been a smoker. She denies any headaches, lower extremity edema, orthopnea or PND.   Past Medical History:  Diagnosis Date   Acid reflux    Chest pain of uncertain etiology 1/54/0086   Pure hypercholesterolemia 09/20/2018    History reviewed. No pertinent surgical history.  Current Medications: Current Meds  Medication Sig   allopurinol (ZYLOPRIM) 300 MG tablet TAKE 1 TABLET BY MOUTH ONCE DAILY   atorvastatin (LIPITOR) 10 MG tablet TAKE 1 TABLET BY MOUTH DAILY.   colchicine 0.6 MG tablet TAKE 2 TABLETS BY MOUTH AT THE ONSET OF A FLARE AND 1 TABLET AN HOUR LATER.   cyclobenzaprine (FLEXERIL) 5 MG tablet Take 1 tablet (5 mg total) by mouth as needed for muscle spasms.   metoprolol tartrate (LOPRESSOR) 50 MG tablet TAKE 1 TABLET BY MOUTH ONCE A DAY   pantoprazole (PROTONIX) 40 MG tablet TAKE 1 TABLET BY MOUTH DAILY.   Vitamin D, Ergocalciferol, (DRISDOL) 1.25 MG (50000 UNIT) CAPS capsule TAKE 1 CAPSULE BY MOUTH 2 TIMES A WEEK.     Allergies:   Patient has no known allergies.   Social History   Socioeconomic History   Marital status: Single    Spouse name: Not on file   Number of children: Not on file   Years of education: Not on file   Highest education level: Not on file  Occupational  History   Not on file  Tobacco Use   Smoking status: Never   Smokeless tobacco: Never  Substance and Sexual Activity   Alcohol use: No   Drug use: No   Sexual activity: Yes    Birth control/protection: I.U.D.  Other Topics Concern   Not on file  Social History Narrative   Not on file   Social Determinants of Health   Financial Resource Strain: Not on file  Food Insecurity: Not on file  Transportation Needs: Not on file  Physical Activity: Not on file  Stress: Not on file  Social Connections: Not on file     Family History: The patient's family history includes Heart attack in her cousin, maternal aunt, and paternal uncle; Heart disease in her  sister; Heart failure in her father. There is no history of Breast cancer.  ROS:   Please see the history of present illness.    (+) Chest pain (+) Acid reflux (+) Hiatal hernia (+) Palpitations (+) Shortness of breath (+) Hot flashes (+) Lightheadedness All other systems reviewed and are negative.  EKGs/Labs/Other Studies Reviewed:    The following studies were reviewed today: No prior CV studies available.  EKG:   02/27/2021: Sinus rhythm. Rate 75 bpm.  Recent Labs: 10/29/2020: ALT 20; BUN 12; Creatinine, Ser 0.72; Hemoglobin 13.6; Platelets 253; Potassium 4.4; Sodium 140  Recent Lipid Panel    Component Value Date/Time   CHOL 233 (H) 10/29/2020 1105   TRIG 82 10/29/2020 1105   HDL 59 10/29/2020 1105   CHOLHDL 3.9 10/29/2020 1105   LDLCALC 160 (H) 10/29/2020 1105     Risk Assessment/Calculations:          Physical Exam:    VS:  BP 124/74 (BP Location: Left Arm, Patient Position: Sitting, Cuff Size: Normal)   Pulse 75   Ht 5\' 2"  (1.575 m)   Wt 164 lb (74.4 kg)   BMI 30.00 kg/m     Wt Readings from Last 3 Encounters:  02/27/21 164 lb (74.4 kg)  10/29/20 165 lb (74.8 kg)  03/22/20 166 lb (75.3 kg)     GEN: Well nourished, well developed in no acute distress HEENT: Normal NECK: No JVD; No carotid bruits LYMPHATICS: No lymphadenopathy CARDIAC: RRR, no murmurs, rubs, gallops RESPIRATORY:  Clear to auscultation without rales, wheezing or rhonchi  ABDOMEN: Soft, non-tender, non-distended MUSCULOSKELETAL:  No edema; No deformity  SKIN: Warm and dry NEUROLOGIC:  Alert and oriented x 3 PSYCHIATRIC:  Normal affect   ASSESSMENT:    1. Palpitations   2. Pure hypercholesterolemia   3. Chest pain of uncertain etiology    PLAN:   Palpitations Patient reports palpitations lasting 15 to 20 minutes weekly.  We will get a 14-day ZIO monitor.  We will also check a comprehensive metabolic panel, CBC, TSH, and magnesium.  We discussed the fact that taking metoprolol  tartrate once daily is somewhat odd.  She has not been taking it regularly.  We will reassess once we get her monitor back.  Pure hypercholesterolemia Lipids are poorly controlled.  She was appropriately started on atorvastatin in March.  Repeat lipids and a CMP and continue atorvastatin.  Chest pain of uncertain etiology She has exertional chest pain and dyspnea.  She also has a family history of several family members with CAD.  We will get a coronary CT-A to assess.  Continue atorvastatin.  Disposition: Follow-up with APP in 2 months. Follow-up with Ahtziri Jeffries C. Oval Linsey, MD, [facility] in 1 year.  Medication Adjustments/Labs and Tests Ordered: Current medicines are reviewed at length with the patient today.  Concerns regarding medicines are outlined above.  Orders Placed This Encounter  Procedures   CT CORONARY MORPH W/CTA COR W/SCORE W/CA W/CM &/OR WO/CM   CBC with Differential/Platelet   T4, free   TSH   Magnesium   Lipid panel   Comprehensive metabolic panel   LONG TERM MONITOR (3-14 DAYS)   EKG 12-Lead   No orders of the defined types were placed in this encounter.   Patient Instructions  Medication Instructions:  TAKE METOPROLOL 50 MG 2 TABLETS 2 HOURS PRIOR TO YOUR CARDIAC CT   *If you need a refill on your cardiac medications before your next appointment, please call your pharmacy*  Lab Work: FASTING CBC/MAGANESIUM/LIPID/CMET/TSH/FT4 A FEW DAYS PRIOR TO YOUR CARDIAC CT  If you have labs (blood work) drawn today and your tests are completely normal, you will receive your results only by: [place] (if you have MyChart) OR A paper copy in the mail If you have any lab test that is abnormal or we need to change your treatment, we will call you to review the results.  Testing/Procedures: Your physician has requested that you have cardiac CT. Cardiac computed tomography (CT) is a painless test that uses an x-ray machine to take clear, detailed pictures of your heart.  For further information please visit [email or web address]. Please follow instruction sheet as given. ONCE INSURANCE HAS BEEN REVIEWED THE OFFICE WILL CALL YOU TO SCHEDULE IF YOU DO NOT HEAR ANYTHING FOR 2 WEEKS CALL THE OFFICE TO FOLLOW UP   ZIO MONITOR-14 DAY PLACED IN OFFICE TODAY   Follow-Up: At [facility], you and your health needs are our priority.  As part of our continuing mission to provide you with exceptional heart care, we have created designated Provider Care Teams.  These Care Teams include your primary Cardiologist (physician) and Advanced Practice Providers (APPs -  Physician Assistants and Nurse Practitioners) who all work together to provide you with the care you need, when you need it.  We recommend signing up for the patient portal called "MyChart".  Sign up information is provided on this After Visit Summary.  MyChart is used to connect with patients for Virtual Visits (Telemedicine).  Patients are able to view lab/test results, encounter notes, upcoming appointments, etc.  Non-urgent messages can be sent to your provider as well.   To learn more about what you can do with MyChart, go to [email or web address].    Your next appointment:   12 month(s)  The format for your next appointment:   In Person  Provider:   Skeet Latch, MD  Your physician recommends that you schedule a follow-up appointment in [place] W NP   Other Instructions    Your cardiac CT will be scheduled at one of the below locations:   [facility] [address] [place], [place] [address] [phone]  [place] [address] [place], [place] [address] [phone]  If scheduled at [facility], please arrive at the [facility] main entrance (entrance A) of [facility] 30 minutes prior to test start time. Proceed to the [facility] Radiology Department (first floor) to check-in and test  prep.  If scheduled at [facility], please arrive 15 mins early for check-in and test prep.  Please follow these instructions carefully (unless otherwise directed):  On the Night Before the Test:  Be sure to Drink plenty of water. Do not consume any caffeinated/decaffeinated beverages or chocolate 12 hours prior to your test. Do not take any antihistamines 12 hours prior to your test. If the patient has contrast allergy: Patient will need a prescription for Prednisone and very clear instructions (as follows): Prednisone 50 mg - take 13 hours prior to test Take another Prednisone 50 mg 7 hours prior to test Take another Prednisone 50 mg 1 hour prior to test Take Benadryl 50 mg 1 hour prior to test Patient must complete all four doses of above prophylactic medications. Patient will need a ride after test due to Benadryl.  On the Day of the Test: Drink plenty of water until 1 hour prior to the test. Do not eat any food 4 hours prior to the test. You may take your regular medications prior to the test.  Take metoprolol (Lopressor) two hours prior to test. HOLD Furosemide/Hydrochlorothiazide morning of the test. FEMALES- please wear underwire-free bra if available, avoid dresses & tight clothing      After the Test: Drink plenty of water. After receiving IV contrast, you may experience a mild flushed feeling. This is normal. On occasion, you may experience a mild rash up to 24 hours after the test. This is not dangerous. If this occurs, you can take Benadryl 25 mg and increase your fluid intake. If you experience trouble breathing, this can be serious. If it is severe call 911 IMMEDIATELY. If it is mild, please call our office. If you take any of these medications: Glipizide/Metformin, Avandament, Glucavance, please do not take 48 hours after completing test unless otherwise instructed.   Once we have confirmed authorization from your insurance company, we will  call you to set up a date and time for your test. Based on how quickly your insurance processes prior authorizations requests, please allow up to 4 weeks to be contacted for scheduling your Cardiac CT appointment. Be advised that routine Cardiac CT appointments could be scheduled as many as 8 weeks after your provider has ordered it.  For non-scheduling related questions, please contact the cardiac imaging nurse navigator should you have any questions/concerns: [name], Cardiac Imaging Nurse Navigator Gordy Clement, Cardiac Imaging Nurse Navigator [facility] Direct Office Dial: [phone]   For scheduling needs, including cancellations and rescheduling, please call [place], [phone].   I,Mathew Stumpf,acting as a Education administrator for Skeet Latch, MD.,have documented all relevant documentation on the behalf of Skeet Latch, MD,as directed by  Skeet Latch, MD while in the presence of Skeet Latch, MD.  I, Rachel Oval Linsey, MD have reviewed all documentation for this visit.  The documentation of the exam, diagnosis, procedures, and orders on 02/27/2021 are all accurate and complete.   Signed, Skeet Latch, MD  02/27/2021 5:52 PM    [facility]

## 2021-03-11 DIAGNOSIS — Z124 Encounter for screening for malignant neoplasm of cervix: Secondary | ICD-10-CM | POA: Diagnosis not present

## 2021-03-11 DIAGNOSIS — Z6829 Body mass index (BMI) 29.0-29.9, adult: Secondary | ICD-10-CM | POA: Diagnosis not present

## 2021-03-11 DIAGNOSIS — Z01419 Encounter for gynecological examination (general) (routine) without abnormal findings: Secondary | ICD-10-CM | POA: Diagnosis not present

## 2021-03-11 DIAGNOSIS — Z01411 Encounter for gynecological examination (general) (routine) with abnormal findings: Secondary | ICD-10-CM | POA: Diagnosis not present

## 2021-03-11 DIAGNOSIS — Z113 Encounter for screening for infections with a predominantly sexual mode of transmission: Secondary | ICD-10-CM | POA: Diagnosis not present

## 2021-03-11 DIAGNOSIS — Z30432 Encounter for removal of intrauterine contraceptive device: Secondary | ICD-10-CM | POA: Diagnosis not present

## 2021-03-13 LAB — HM PAP SMEAR

## 2021-03-15 DIAGNOSIS — R002 Palpitations: Secondary | ICD-10-CM | POA: Diagnosis not present

## 2021-03-21 ENCOUNTER — Telehealth (HOSPITAL_COMMUNITY): Payer: Self-pay | Admitting: Emergency Medicine

## 2021-03-21 NOTE — Telephone Encounter (Signed)
Reaching out to patient to offer assistance regarding upcoming cardiac imaging study; pt verbalizes understanding of appt date/time, parking situation and where to check in, pre-test NPO status and medications ordered, and verified current allergies; name and call back number provided for further questions should they arise Jade Bond RN Navigator Cardiac Imaging [place] [facility] [phone] office [phone] cell  Denies iv issues '50mg'$  metoprolol  Pt states she will attempt to get labs in the lab in her office bldg and bring results with her to appt if needed.

## 2021-03-22 DIAGNOSIS — R079 Chest pain, unspecified: Secondary | ICD-10-CM | POA: Diagnosis not present

## 2021-03-22 DIAGNOSIS — E7800 Pure hypercholesterolemia, unspecified: Secondary | ICD-10-CM | POA: Diagnosis not present

## 2021-03-22 DIAGNOSIS — R002 Palpitations: Secondary | ICD-10-CM | POA: Diagnosis not present

## 2021-03-22 LAB — COMPREHENSIVE METABOLIC PANEL
ALT: 15 IU/L (ref 0–32)
AST: 18 IU/L (ref 0–40)
Albumin/Globulin Ratio: 1.7 (ref 1.2–2.2)
Albumin: 3.4 g/dL — ABNORMAL LOW (ref 3.8–4.9)
Alkaline Phosphatase: 49 IU/L (ref 44–121)
BUN/Creatinine Ratio: 22 (ref 9–23)
BUN: 17 mg/dL (ref 6–24)
Bilirubin Total: 0.3 mg/dL (ref 0.0–1.2)
CO2: 22 mmol/L (ref 20–29)
Calcium: 8.7 mg/dL (ref 8.7–10.2)
Chloride: 104 mmol/L (ref 96–106)
Creatinine, Ser: 0.76 mg/dL (ref 0.57–1.00)
Globulin, Total: 2 g/dL (ref 1.5–4.5)
Glucose: 68 mg/dL (ref 65–99)
Potassium: 4.1 mmol/L (ref 3.5–5.2)
Sodium: 142 mmol/L (ref 134–144)
Total Protein: 5.4 g/dL — ABNORMAL LOW (ref 6.0–8.5)
eGFR: 90 mL/min/{1.73_m2} (ref >59)

## 2021-03-22 LAB — LIPID PANEL
Chol/HDL Ratio: 3.5 ratio (ref 0.0–4.4)
Cholesterol, Total: 164 mg/dL (ref 100–199)
HDL: 47 mg/dL (ref >39)
LDL Chol Calc (NIH): 88 mg/dL (ref 0–99)
Triglycerides: 170 mg/dL — ABNORMAL HIGH (ref 0–149)
VLDL Cholesterol Cal: 29 mg/dL (ref 5–40)

## 2021-03-22 LAB — CBC WITH DIFFERENTIAL/PLATELET
Basophils Absolute: 0 10*3/uL (ref 0.0–0.2)
Basos: 1 %
EOS (ABSOLUTE): 0.2 10*3/uL (ref 0.0–0.4)
Eos: 3 %
Hematocrit: 44.1 % (ref 34.0–46.6)
Hemoglobin: 14.6 g/dL (ref 11.1–15.9)
Immature Grans (Abs): 0 10*3/uL (ref 0.0–0.1)
Immature Granulocytes: 0 %
Lymphocytes Absolute: 1.9 10*3/uL (ref 0.7–3.1)
Lymphs: 26 %
MCH: 29.6 pg (ref 26.6–33.0)
MCHC: 33.1 g/dL (ref 31.5–35.7)
MCV: 89 fL (ref 79–97)
Monocytes Absolute: 0.4 10*3/uL (ref 0.1–0.9)
Monocytes: 5 %
Neutrophils Absolute: 4.7 10*3/uL (ref 1.4–7.0)
Neutrophils: 65 %
Platelets: 268 10*3/uL (ref 150–450)
RBC: 4.94 x10E6/uL (ref 3.77–5.28)
RDW: 12.9 % (ref 11.7–15.4)
WBC: 7.2 10*3/uL (ref 3.4–10.8)

## 2021-03-22 LAB — TSH: TSH: 1.22 u[IU]/mL (ref 0.450–4.500)

## 2021-03-22 LAB — MAGNESIUM: Magnesium: 1.8 mg/dL (ref 1.6–2.3)

## 2021-03-22 LAB — T4, FREE: Free T4: 1.02 ng/dL (ref 0.82–1.77)

## 2021-03-25 ENCOUNTER — Ambulatory Visit (HOSPITAL_COMMUNITY): Payer: 59

## 2021-04-12 ENCOUNTER — Other Ambulatory Visit (HOSPITAL_COMMUNITY): Payer: Self-pay

## 2021-04-12 ENCOUNTER — Telehealth (HOSPITAL_BASED_OUTPATIENT_CLINIC_OR_DEPARTMENT_OTHER): Payer: Self-pay | Admitting: *Deleted

## 2021-04-12 DIAGNOSIS — R002 Palpitations: Secondary | ICD-10-CM

## 2021-04-12 MED ORDER — METOPROLOL TARTRATE 50 MG PO TABS
50.0000 mg | ORAL_TABLET | Freq: Two times a day (BID) | ORAL | 3 refills | Status: DC
  Filled 2021-04-12: qty 180, 90d supply, fill #0
  Filled 2022-03-17: qty 180, 90d supply, fill #1

## 2021-04-12 NOTE — Telephone Encounter (Signed)
-----   Message from Skeet Latch, MD sent at 04/08/2021  7:40 PM EDT ----- Monitor showed some early heartbeats from the top and bottom chambers of the heart.  These are not dangerous but annoying.  Recommend increasing her metoprolol to twice a day to see if that helps.

## 2021-04-12 NOTE — Telephone Encounter (Signed)
Released in my chart with Dr Blenda Mounts comments attached and to call if any questions Unable to reach via phone

## 2021-05-01 ENCOUNTER — Ambulatory Visit: Payer: 59 | Admitting: Nurse Practitioner

## 2021-05-03 ENCOUNTER — Ambulatory Visit (INDEPENDENT_AMBULATORY_CARE_PROVIDER_SITE_OTHER): Payer: 59 | Admitting: Family

## 2021-05-03 ENCOUNTER — Encounter (HOSPITAL_BASED_OUTPATIENT_CLINIC_OR_DEPARTMENT_OTHER): Payer: Self-pay | Admitting: Family

## 2021-05-03 ENCOUNTER — Other Ambulatory Visit: Payer: Self-pay

## 2021-05-03 VITALS — BP 120/70 | HR 73 | Ht 62.0 in | Wt 164.0 lb

## 2021-05-03 DIAGNOSIS — E782 Mixed hyperlipidemia: Secondary | ICD-10-CM | POA: Diagnosis not present

## 2021-05-03 DIAGNOSIS — I493 Ventricular premature depolarization: Secondary | ICD-10-CM

## 2021-05-03 DIAGNOSIS — Z8249 Family history of ischemic heart disease and other diseases of the circulatory system: Secondary | ICD-10-CM

## 2021-05-03 DIAGNOSIS — K219 Gastro-esophageal reflux disease without esophagitis: Secondary | ICD-10-CM

## 2021-05-03 DIAGNOSIS — R002 Palpitations: Secondary | ICD-10-CM

## 2021-05-03 NOTE — Patient Instructions (Addendum)
Medication Instructions:  Continue your current medications.   *If you need a refill on your cardiac medications before your next appointment, please call your pharmacy*   Lab Work: None ordered today.   Your recent LDL was 88 which is a good result!   Testing/Procedures: We recommend a calcium score to look for any coronary artery disease. They are $99 out of pocket cost. If you wish to have this done, simply call us and we will order.  Follow-Up: At [facility], you and your health needs are our priority.  As part of our continuing mission to provide you with exceptional heart care, we have created designated Provider Care Teams.  These Care Teams include your primary Cardiologist (physician) and Advanced Practice Providers (APPs -  Physician Assistants and Nurse Practitioners) who all work together to provide you with the care you need, when you need it.  We recommend signing up for the patient portal called "MyChart".  Sign up information is provided on this After Visit Summary.  MyChart is used to connect with patients for Virtual Visits (Telemedicine).  Patients are able to view lab/test results, encounter notes, upcoming appointments, etc.  Non-urgent messages can be sent to your provider as well.   To learn more about what you can do with MyChart, go to [email or web address].    Your next appointment:   July 2023 with Dr. Oval Linsey  Other Instructions  Heart Healthy Diet Recommendations: A low-salt diet is recommended. Meats should be grilled, baked, or boiled. Avoid fried foods. Focus on lean protein sources like fish or chicken with vegetables and fruits. The American Heart Association is a Microbiologist!    Exercise recommendations: The American Heart Association recommends 150 minutes of moderate intensity exercise weekly. Try 30 minutes of moderate intensity exercise 4-5 times per week. This could include walking, jogging, or swimming.

## 2021-05-03 NOTE — Progress Notes (Signed)
Office Visit    Patient Name: Jade Owen Date of Encounter: 05/04/2021  PCP:  Minette Brine, [facility]  Cardiologist:  Skeet Latch, MD  Advanced Practice Provider:  No care team member to display Electrophysiologist:  None   Chief Complaint    Jade Owen is a 60 y.o. female with a hx of GERD, hyperlipidemia, palpitations presents today for follow-up after ZIO monitoring  Past Medical History    Past Medical History:  Diagnosis Date   Acid reflux    Chest pain of uncertain etiology 0000000   Pure hypercholesterolemia 09/20/2018   History reviewed. No pertinent surgical history.  Allergies  No Known Allergies  History of Present Illness    Jade Owen is a 60 y.o. female with a hx of GERD, hyperlipidemia, palpitations last seen 02/27/2021 by Dr. Oval Linsey.  Seen in consult for palpitations and family history of CAD. She was recommended for ZIO and cardiac CTA. ZIO 03/15/21 with one episode mobitz 1 second-degree heart block, rare PVC, 4 beats SVT. Metoprolol tartrate was increased to '50mg'$  BID as she was only taking QD.  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 Reports compliance with Metoprolol '50mg'$  BID with improvement in palpitations. Feels a bit "slowed down" but overall tolerates. Few day history of knee pain which she attributes with gout and is planning to discuss with Dr. Tamala Julian. Tells me cardiac CTA was too expensive at $1100 and she has had no recurrent chest pain, pressure, tightness. Denies shortness of breath or dyspnea on exertion.  EKGs/Labs/Other Studies Reviewed:   The following studies were reviewed today:  ZIO 03/15/21   Quality: Fair.  Baseline artifact. Predominant rhythm: sinus rhythm Average heart rate: 87 bpm Max heart rate: 195 bpm Min heart rate: 33 bpm Pauses >2.5 seconds: none   One episode of Mobitz 1 second-degree heart block Rare PVCs 4 beats SVT   Patient  reported an episode of feeling lightheaded, dizziness, and syncope at which time sinus tachycardia 106 bpm was noted. Patient reported shortness of breath and fluttering, at which time sinus rhythm at 79 bpm was noted.    EKG: No EKG today  Recent Labs: 03/22/2021: ALT 15; BUN 17; Creatinine, Ser 0.76; Hemoglobin 14.6; Magnesium 1.8; Platelets 268; Potassium 4.1; Sodium 142; TSH 1.220  Recent Lipid Panel    Component Value Date/Time   CHOL 164 03/22/2021 0830   TRIG 170 (H) 03/22/2021 0830   HDL 47 03/22/2021 0830   CHOLHDL 3.5 03/22/2021 0830   LDLCALC 88 03/22/2021 0830   Home Medications   Current Meds  Medication Sig   allopurinol (ZYLOPRIM) 300 MG tablet TAKE 1 TABLET BY MOUTH ONCE DAILY   atorvastatin (LIPITOR) 10 MG tablet TAKE 1 TABLET BY MOUTH DAILY.   colchicine 0.6 MG tablet TAKE 2 TABLETS BY MOUTH AT THE ONSET OF A FLARE AND 1 TABLET AN HOUR LATER.   cyclobenzaprine (FLEXERIL) 5 MG tablet Take 1 tablet (5 mg total) by mouth as needed for muscle spasms.   metoprolol tartrate (LOPRESSOR) 50 MG tablet Take 1 tablet (50 mg total) by mouth 2 (two) times daily.   ondansetron (ZOFRAN ODT) 8 MG disintegrating tablet Take 1 tablet (8 mg total) by mouth every 8 (eight) hours as needed for nausea or vomiting.   pantoprazole (PROTONIX) 40 MG tablet TAKE 1 TABLET BY MOUTH DAILY.   Vitamin D, Ergocalciferol, (DRISDOL) 1.25 MG (50000 UNIT) CAPS capsule TAKE 1 CAPSULE  BY MOUTH 2 TIMES A WEEK.     Review of Systems      All other systems reviewed and are otherwise negative except as noted above.  Physical Exam    VS:  BP 120/70   Pulse 73   Ht '5\' 2"'$  (1.575 m)   Wt 164 lb (74.4 kg)   SpO2 99%   BMI 30.00 kg/m  , BMI Body mass index is 30 kg/m.  Wt Readings from Last 3 Encounters:  05/03/21 164 lb (74.4 kg)  02/27/21 164 lb (74.4 kg)  10/29/20 165 lb (74.8 kg)     GEN: Well nourished, well developed, in no acute distress. HEENT: normal. Neck: Supple, no JVD, carotid  bruits, or masses. Cardiac: RRR, no murmurs, rubs, or gallops. No clubbing, cyanosis, edema.  Radials/PT 2+ and equal bilaterally.  Respiratory:  Respirations regular and unlabored, clear to auscultation bilaterally. GI: Soft, nontender, nondistended. MS: No deformity or atrophy. Skin: Warm and dry, no rash. Neuro:  Strength and sensation are intact. Psych: Normal affect.  Assessment & Plan    Chest pain - No recurrence. Tells me cardiac CTA was too expensive (quoted at >$1000). Given risk factors including HLD, family history of CAD discussed coronary calcium scoring which is $99 out of pocket cost. Tells me she will consider and call us when she would like it ordered.   Palpitations - ZIO with infrequent PAC/PVC. Continue Lopressor '50mg'$  BID. Encouraged to stay well hydrated, limit caffeine, manage stress well, and avoid etoh/tobacco products.   HLD, LDL goal <100 - Most recent LDL 88. Continue Atorvastatin '10mg'$  QD.   GERD - Well controlled on Protonix '40mg'$  QD.  Gout - With present flare in her knee. Encouraged to reach out to sports medicine team.   Disposition: Follow up  July 2023  with Dr. Oval Linsey or APP.  Signed, Loel Dubonnet, NP 05/04/2021, 8:13 PM [place]

## 2021-05-06 ENCOUNTER — Encounter: Payer: Self-pay | Admitting: Family Medicine

## 2021-05-06 ENCOUNTER — Ambulatory Visit (INDEPENDENT_AMBULATORY_CARE_PROVIDER_SITE_OTHER): Payer: 59 | Admitting: Family Medicine

## 2021-05-06 ENCOUNTER — Other Ambulatory Visit: Payer: Self-pay

## 2021-05-06 ENCOUNTER — Ambulatory Visit: Payer: Self-pay

## 2021-05-06 ENCOUNTER — Ambulatory Visit (INDEPENDENT_AMBULATORY_CARE_PROVIDER_SITE_OTHER): Payer: 59

## 2021-05-06 VITALS — BP 120/78 | HR 65 | Ht 62.0 in | Wt 164.0 lb

## 2021-05-06 DIAGNOSIS — M1A4620 Other secondary chronic gout, left knee, without tophus (tophi): Secondary | ICD-10-CM | POA: Diagnosis not present

## 2021-05-06 DIAGNOSIS — M25562 Pain in left knee: Secondary | ICD-10-CM

## 2021-05-06 NOTE — Patient Instructions (Addendum)
Good to see you today.  Please get uric acid labs on your way out.  You had a L knee injection.  Call or go to the ER if you develop a large red swollen joint with extreme pain or oozing puss.   Please use Voltaren gel (Generic Diclofenac Gel) up to 4x daily for pain as needed.  This is available over-the-counter as both the name brand Voltaren gel and the generic diclofenac gel.   Follow-up as needed.

## 2021-05-06 NOTE — Progress Notes (Signed)
I, Wendy Poet, LAT, ATC, am serving as scribe for Dr. Lynne Leader.  Jade Owen is a 60 y.o. female who presents to [place] at [facility] today for L knee pain. Pt was previously seen by Dr. Tamala Julian on 03/22/20 for bilat calf pain. Of note, pt has a hx of gout and had a flare in Jan 2021 and was prescribed colchicine by Dr. Tamala Julian. Today, pt c/o L knee pain x 2.5-3 weeks w/ no known MOI. Pt locates pain to her L ant knee.  She reports a burning-type pain in her lateral knee.  L knee swelling: yes Mechanical symptoms: yes Aggravates: walking; climbing stairs Treatments tried: colchicine; IBU; ice; heat; knee brace; compression sleeve  Pertinent review of systems: No fevers or chills  Relevant historical information: History of gout   Exam:  BP 120/78 (BP Location: Right Arm, Patient Position: Sitting, Cuff Size: Normal)   Pulse 65   Ht 5\' 2"  (1.575 m)   Wt 164 lb (74.4 kg)   SpO2 95%   BMI 30.00 kg/m  General: Well Developed, well nourished, and in no acute distress.   MSK: Left knee moderate effusion normal motion with crepitation tender palpation medial joint line.  Stable ligamentous exam.  Intact strength.    Lab and Radiology Results  Procedure: Real-time Ultrasound Guided Injection of left knee superior lateral patellar space Device: Philips Affiniti 50G Images permanently stored and available for review in PACS Ultrasound evaluation prior to injection reveals moderate joint effusion.  Narrowed medial and significantly narrowed lateral joint line with extruded lateral meniscus. Verbal informed consent obtained.  Discussed risks and benefits of procedure. Warned about infection bleeding damage to structures skin hypopigmentation and fat atrophy among others. Patient expresses understanding and agreement Time-out conducted.   Noted no overlying erythema, induration, or other signs of local infection.   Skin prepped in a sterile fashion.   Local  anesthesia: Topical Ethyl chloride.   With sterile technique and under real time ultrasound guidance: 40 mg of Kenalog and 2 mL of lidocaine injected into knee joint. Fluid seen entering the joint capsule.   Completed without difficulty   Pain immediately resolved suggesting accurate placement of the medication.   Advised to call if fevers/chills, erythema, induration, drainage, or persistent bleeding.   Images permanently stored and available for review in the ultrasound unit.  Impression: Technically successful ultrasound guided injection.    X-ray images left knee obtained today personally and independently interpreted. Moderate DJD lateral joint line.  No acute fractures. Await formal radiology review    Assessment and Plan: 60 y.o. female with knee pain.  Patient does have a history of gout but I do not think this is a gout exacerbation as it does not feel like a gout exacerbation to her and colchicine has not been effective.  She does have significant degenerative changes seen on x-ray per my read today.  Radiology overread is still pending.  Plan for steroid injection.  If not improving next step would be hyaluronic acid injection.  We will work on authorization for this now.  We will go ahead and check uric acid as well.    PDMP not reviewed this encounter. Orders Placed This Encounter  Procedures   [place] LIMITED JOINT SPACE STRUCTURES LOW LEFT(NO LINKED CHARGES)    Standing Status:   Future    Number of Occurrences:   1    Standing Expiration Date:   11/03/2021    Order Specific Question:  Reason for Exam (SYMPTOM  OR DIAGNOSIS REQUIRED)    Answer:   left knee pain    Order Specific Question:   Preferred imaging location?    Answer:   [facility]   DG Knee AP/LAT W/[facility] Left    Standing Status:   Future    Number of Occurrences:   1    Standing Expiration Date:   06/05/2021    Order Specific Question:   Reason for Exam (SYMPTOM  OR DIAGNOSIS  REQUIRED)    Answer:   L knee pain    Order Specific Question:   Preferred imaging location?    Answer:   [facility]    Order Specific Question:   Is patient pregnant?    Answer:   No   Uric acid    Standing Status:   Future    Number of Occurrences:   1    Standing Expiration Date:   05/06/2022   No orders of the defined types were placed in this encounter.    Discussed warning signs or symptoms. Please see discharge instructions. Patient expresses understanding.   The above documentation has been reviewed and is accurate and complete Lynne Leader, M.D.

## 2021-05-07 LAB — URIC ACID: Uric Acid, Serum: 2.8 mg/dL (ref 2.4–7.0)

## 2021-05-07 NOTE — Progress Notes (Signed)
Uric acid is normal.  Gout extremely unlikely

## 2021-05-08 NOTE — Progress Notes (Signed)
Left knee x-ray shows arthritis especially bad in the lateral part of the knee.

## 2021-05-17 ENCOUNTER — Other Ambulatory Visit (HOSPITAL_COMMUNITY): Payer: Self-pay

## 2021-05-17 ENCOUNTER — Other Ambulatory Visit: Payer: Self-pay | Admitting: Nurse Practitioner

## 2021-05-17 ENCOUNTER — Other Ambulatory Visit: Payer: Self-pay | Admitting: Family Medicine

## 2021-05-17 DIAGNOSIS — E559 Vitamin D deficiency, unspecified: Secondary | ICD-10-CM

## 2021-05-17 MED ORDER — VITAMIN D (ERGOCALCIFEROL) 1.25 MG (50000 UNIT) PO CAPS
ORAL_CAPSULE | ORAL | 1 refills | Status: DC
  Filled 2021-05-17: qty 24, 84d supply, fill #0
  Filled 2021-09-09: qty 24, 84d supply, fill #1

## 2021-05-17 MED FILL — Atorvastatin Calcium Tab 10 MG (Base Equivalent): ORAL | 30 days supply | Qty: 30 | Fill #1 | Status: AC

## 2021-05-20 ENCOUNTER — Other Ambulatory Visit: Payer: Self-pay | Admitting: Family Medicine

## 2021-05-20 ENCOUNTER — Other Ambulatory Visit (HOSPITAL_COMMUNITY): Payer: Self-pay

## 2021-05-22 ENCOUNTER — Other Ambulatory Visit (HOSPITAL_COMMUNITY): Payer: Self-pay

## 2021-05-22 MED FILL — Allopurinol Tab 300 MG: ORAL | 90 days supply | Qty: 90 | Fill #0 | Status: AC

## 2021-05-23 ENCOUNTER — Other Ambulatory Visit (HOSPITAL_COMMUNITY): Payer: Self-pay

## 2021-05-27 ENCOUNTER — Other Ambulatory Visit (HOSPITAL_COMMUNITY): Payer: Self-pay

## 2021-09-09 ENCOUNTER — Other Ambulatory Visit: Payer: Self-pay | Admitting: Nurse Practitioner

## 2021-09-09 ENCOUNTER — Other Ambulatory Visit (HOSPITAL_COMMUNITY): Payer: Self-pay

## 2021-09-09 MED ORDER — PANTOPRAZOLE SODIUM 40 MG PO TBEC
DELAYED_RELEASE_TABLET | Freq: Every day | ORAL | 1 refills | Status: DC
  Filled 2021-09-09: qty 90, 90d supply, fill #0

## 2021-09-09 MED FILL — Allopurinol Tab 300 MG: ORAL | 90 days supply | Qty: 90 | Fill #1 | Status: AC

## 2021-09-09 MED FILL — Atorvastatin Calcium Tab 10 MG (Base Equivalent): ORAL | 30 days supply | Qty: 30 | Fill #2 | Status: AC

## 2021-09-10 ENCOUNTER — Other Ambulatory Visit (HOSPITAL_COMMUNITY): Payer: Self-pay

## 2021-09-16 ENCOUNTER — Other Ambulatory Visit: Payer: Self-pay | Admitting: Nurse Practitioner

## 2021-09-16 DIAGNOSIS — Z1231 Encounter for screening mammogram for malignant neoplasm of breast: Secondary | ICD-10-CM

## 2021-11-04 ENCOUNTER — Ambulatory Visit
Admission: RE | Admit: 2021-11-04 | Discharge: 2021-11-04 | Disposition: A | Discharge status: Home / Self Care | Payer: 59 | Source: Ambulatory Visit | Attending: Nurse Practitioner | Admitting: Nurse Practitioner

## 2021-11-04 ENCOUNTER — Other Ambulatory Visit: Payer: Self-pay

## 2021-11-04 ENCOUNTER — Ambulatory Visit (INDEPENDENT_AMBULATORY_CARE_PROVIDER_SITE_OTHER): Payer: 59 | Admitting: Nurse Practitioner

## 2021-11-04 ENCOUNTER — Encounter: Payer: Self-pay | Admitting: Nurse Practitioner

## 2021-11-04 ENCOUNTER — Other Ambulatory Visit (HOSPITAL_COMMUNITY): Payer: Self-pay

## 2021-11-04 VITALS — BP 116/78 | HR 67 | Temp 97.9°F | Ht 62.0 in | Wt 166.6 lb

## 2021-11-04 DIAGNOSIS — Z2821 Immunization not carried out because of patient refusal: Secondary | ICD-10-CM

## 2021-11-04 DIAGNOSIS — Z6830 Body mass index (BMI) 30.0-30.9, adult: Secondary | ICD-10-CM | POA: Diagnosis not present

## 2021-11-04 DIAGNOSIS — K219 Gastro-esophageal reflux disease without esophagitis: Secondary | ICD-10-CM | POA: Diagnosis not present

## 2021-11-04 DIAGNOSIS — Z79899 Other long term (current) drug therapy: Secondary | ICD-10-CM

## 2021-11-04 DIAGNOSIS — E782 Mixed hyperlipidemia: Secondary | ICD-10-CM

## 2021-11-04 DIAGNOSIS — R110 Nausea: Secondary | ICD-10-CM

## 2021-11-04 DIAGNOSIS — Z1231 Encounter for screening mammogram for malignant neoplasm of breast: Secondary | ICD-10-CM | POA: Diagnosis not present

## 2021-11-04 DIAGNOSIS — E559 Vitamin D deficiency, unspecified: Secondary | ICD-10-CM | POA: Diagnosis not present

## 2021-11-04 DIAGNOSIS — Z0000 Encounter for general adult medical examination without abnormal findings: Secondary | ICD-10-CM | POA: Diagnosis not present

## 2021-11-04 DIAGNOSIS — E66811 Obesity, class 1: Secondary | ICD-10-CM

## 2021-11-04 DIAGNOSIS — E6609 Other obesity due to excess calories: Secondary | ICD-10-CM

## 2021-11-04 MED ORDER — ATORVASTATIN CALCIUM 10 MG PO TABS
ORAL_TABLET | Freq: Every day | ORAL | 1 refills | Status: DC
Start: 2021-11-04 — End: 2022-06-23
  Filled 2021-11-04: qty 90, 90d supply, fill #0
  Filled 2022-03-17: qty 90, 90d supply, fill #1

## 2021-11-04 MED ORDER — ONDANSETRON 8 MG PO TBDP
8.0000 mg | ORAL_TABLET | Freq: Three times a day (TID) | ORAL | 0 refills | Status: DC | PRN
  Filled 2021-11-04: qty 30, 10d supply, fill #0

## 2021-11-04 MED ORDER — DEXLANSOPRAZOLE 60 MG PO CPDR
60.0000 mg | DELAYED_RELEASE_CAPSULE | Freq: Every day | ORAL | 3 refills | Status: DC
  Filled 2021-11-04: qty 30, 30d supply, fill #0

## 2021-11-04 NOTE — Patient Instructions (Signed)

## 2021-11-04 NOTE — Progress Notes (Signed)
?[employer] as a Education administrator for [employer], FNP.,have documented all relevant documentation on the behalf of Minette Brine, FNP,as directed by  Minette Brine, FNP while in the presence of Minette Brine, [place].  ? ?This visit occurred during the SARS-CoV-2 public health emergency.  Safety protocols were in place, including screening questions prior to the visit, additional usage of staff PPE, and extensive cleaning of exam room while observing appropriate contact time as indicated for disinfecting solutions. ? ?Subjective:  ?  ? Patient ID: Jade Owen , female    DOB: April 19, 1961 , 61 y.o.   MRN: [id number] ? ? ?Chief Complaint  ?Patient presents with  ? Annual Exam  ? ? ?HPI ? ?Patient here for hm. The patient is followed by Dr. Adela Glimpse at [facility]. Last pap was 04/2021.  Has been to sports medicine for her left knee, at some point will need a knee replacement (she has arthritis in addition to having gout).  ? ?She has been on nexium, omeprazole and now on protonix which she does not feel has been effective.  ?  ? ?Past Medical History:  ?Diagnosis Date  ? Acid reflux   ? Chest pain of uncertain etiology 7/84/6962  ? Pure hypercholesterolemia 09/20/2018  ?  ? ?Family History  ?Problem Relation Age of Onset  ? Heart failure Father   ? Heart disease Sister   ? Heart attack Maternal Aunt   ? Heart attack Paternal Uncle   ? Heart attack Cousin   ? Breast cancer Neg Hx   ? ? ? ?Current Outpatient Medications:  ?  allopurinol (ZYLOPRIM) 300 MG tablet, TAKE 1 TABLET BY MOUTH ONCE DAILY, Disp: 90 tablet, Rfl: 1 ?  dexlansoprazole (DEXILANT) 60 MG capsule, Take 1 capsule (60 mg total) by mouth daily., Disp: 30 capsule, Rfl: 3 ?  metoprolol tartrate (LOPRESSOR) 50 MG tablet, Take 1 tablet (50 mg total) by mouth 2 (two) times daily., Disp: 180 tablet, Rfl: 3 ?  Vitamin D, Ergocalciferol, (DRISDOL) 1.25 MG (50000 UNIT) CAPS capsule, TAKE 1 CAPSULE BY MOUTH 2 TIMES A WEEK., Disp: 24 capsule, Rfl: 1 ?   atorvastatin (LIPITOR) 10 MG tablet, TAKE 1 TABLET BY MOUTH DAILY., Disp: 90 tablet, Rfl: 1 ?  colchicine 0.6 MG tablet, TAKE 2 TABLETS BY MOUTH AT THE ONSET OF A FLARE AND 1 TABLET AN HOUR LATER. (Patient not taking: Reported on 11/04/2021), Disp: 10 tablet, Rfl: 3 ?  cyclobenzaprine (FLEXERIL) 5 MG tablet, Take 1 tablet (5 mg total) by mouth as needed for muscle spasms. (Patient not taking: Reported on 05/06/2021), Disp: 20 tablet, Rfl: 0 ?  ondansetron (ZOFRAN-ODT) 8 MG disintegrating tablet, Dissolve 1 tablet (8 mg total) by mouth every 8 (eight) hours as needed for nausea or vomiting., Disp: 30 tablet, Rfl: 0  ? ?No Known Allergies  ? ? ?The patient states she is postmenopausal. No LMP recorded. Patient is postmenopausal.  Negative for Dysmenorrhea and Negative for Menorrhagia. Negative for: breast discharge, breast lump(s), breast pain and breast self exam. Associated symptoms include abnormal vaginal bleeding. Pertinent negatives include abnormal bleeding (hematology), anxiety, decreased libido, depression, difficulty falling sleep, dyspareunia, history of infertility, nocturia, sexual dysfunction, sleep disturbances, urinary incontinence, urinary urgency, vaginal discharge and vaginal itching. Diet regular.  The patient states her exercise level is minimal she uses bands to exercise. The morning she has morning stiffness. She is now working from home.  ?  ?The patient's tobacco use is:  ?Social History  ? ?Tobacco Use  ?  Smoking Status Never  ?Smokeless Tobacco Never  ? ?She has been exposed to passive smoke. The patient's alcohol use is:  ?Social History  ? ?Substance and Sexual Activity  ?Alcohol Use No  ? ?Additional information: Last pap 04/2021, next one scheduled for 04/2022.   ? ?Review of Systems  ?Constitutional: Negative.   ?HENT: Negative.    ?Eyes:  Positive for visual disturbance (left eye blurred vision and aching pain).  ?Respiratory: Negative.    ?Cardiovascular:  Negative for chest pain,  palpitations and leg swelling.  ?Gastrointestinal: Negative.   ?Endocrine: Negative.   ?Genitourinary: Negative.   ?Musculoskeletal: Negative.   ?Skin: Negative.   ?Allergic/Immunologic: Negative.   ?Neurological: Negative.   ?Hematological: Negative.   ?Psychiatric/Behavioral: Negative.     ? ?Today's Vitals  ? 11/04/21 0836  ?BP: 116/78  ?Pulse: 67  ?Temp: 97.9 ?F (36.6 ?C)  ?Weight: 166 lb 9.6 oz (75.6 kg)  ?Height: _0  (1.575 m)  ? ?Body mass index is 30.47 kg/m?.  ?Wt Readings from Last 3 Encounters:  ?11/04/21 166 lb 9.6 oz (75.6 kg)  ?05/06/21 164 lb (74.4 kg)  ?05/03/21 164 lb (74.4 kg)  ? .las ?Objective:  ?Physical Exam ?Vitals reviewed.  ?Constitutional:   ?   General: She is not in acute distress. ?   Appearance: Normal appearance. She is well-developed and normal weight.  ?HENT:  ?   Head: Normocephalic and atraumatic.  ?   Right Ear: Hearing, tympanic membrane, ear canal and external ear normal. There is no impacted cerumen.  ?   Left Ear: Hearing, tympanic membrane, ear canal and external ear normal. There is no impacted cerumen.  ?   Nose:  ?   Comments: Deferred - masked ?   Mouth/Throat:  ?   Comments: Deferred - masked ?Eyes:  ?   General: Lids are normal.  ?   Extraocular Movements: Extraocular movements intact.  ?   Conjunctiva/sclera: Conjunctivae normal.  ?   Pupils: Pupils are equal, round, and reactive to light.  ?   Funduscopic exam: ?   Right eye: No papilledema.     ?   Left eye: No papilledema.  ?Neck:  ?   Thyroid: No thyroid mass.  ?   Vascular: No carotid bruit.  ?Cardiovascular:  ?   Rate and Rhythm: Normal rate and regular rhythm.  ?   Pulses: Normal pulses.  ?   Heart sounds: Normal heart sounds. No murmur heard. ?Pulmonary:  ?   Effort: Pulmonary effort is normal.  ?   Breath sounds: Normal breath sounds.  ?Chest:  ?   Chest wall: No mass.  ?Breasts: ?   Tanner Score is 5.  ?   Right: Normal. No mass or tenderness.  ?   Left: Normal. No mass or tenderness.  ?Abdominal:  ?    General: Abdomen is flat. Bowel sounds are normal. There is no distension.  ?   Palpations: Abdomen is soft.  ?   Tenderness: There is no abdominal tenderness.  ?Genitourinary: ?   Comments: Deferred - seen by GYN.  ?Musculoskeletal:     ?   General: No swelling. Normal range of motion.  ?   Cervical back: Full passive range of motion without pain, normal range of motion and neck supple.  ?   Right lower leg: No edema.  ?   Left lower leg: No edema.  ?Lymphadenopathy:  ?   Upper Body:  ?   Right upper body: No supraclavicular, axillary  or pectoral adenopathy.  ?   Left upper body: No supraclavicular, axillary or pectoral adenopathy.  ?Skin: ?   General: Skin is warm and dry.  ?   Capillary Refill: Capillary refill takes less than 2 seconds.  ?Neurological:  ?   General: No focal deficit present.  ?   Mental Status: She is alert and oriented to person, place, and time.  ?   Cranial Nerves: No cranial nerve deficit.  ?   Sensory: No sensory deficit.  ?Psychiatric:     ?   Mood and Affect: Mood normal.     ?   Behavior: Behavior normal.     ?   Thought Content: Thought content normal.     ?   Judgment: Judgment normal.  ?  ? ?   ?Assessment And Plan:  ?   ?1. Encounter for general adult medical examination w/o abnormal findings ?Behavior modifications discussed and diet history reviewed.   ?Pt will continue to exercise regularly and modify diet with low GI, plant based foods and decrease intake of processed foods.  ?Recommend intake of daily multivitamin, Vitamin D, and calcium.  ?Recommend mammogram and colonoscopy for preventive screenings, as well as recommend immunizations that include influenza, TDAP, and Shingles (declined) ? ?2. Mixed hyperlipidemia ?Comments: Continue statin, tolerating well.  ?- CMP14+EGFR ?- Lipid panel ?- atorvastatin (LIPITOR) 10 MG tablet; TAKE 1 TABLET BY MOUTH DAILY.  Dispense: 90 tablet; Refill: 1 ? ?3. Vitamin D deficiency ?Will check vitamin D level and supplement as needed.    ?Also  encouraged to spend 15 minutes in the sun daily.  ?- VITAMIN D 25 Hydroxy (Vit-D Deficiency, Fractures) ? ?4. Gastroesophageal reflux disease without esophagitis ?Comments: Will try her on dexilant, omeprazole and nex

## 2021-11-05 LAB — CBC
Hematocrit: 40.6 % (ref 34.0–46.6)
Hemoglobin: 13.7 g/dL (ref 11.1–15.9)
MCH: 30.1 pg (ref 26.6–33.0)
MCHC: 33.7 g/dL (ref 31.5–35.7)
MCV: 89 fL (ref 79–97)
Platelets: 275 10*3/uL (ref 150–450)
RBC: 4.55 x10E6/uL (ref 3.77–5.28)
RDW: 12.5 % (ref 11.7–15.4)
WBC: 5.3 10*3/uL (ref 3.4–10.8)

## 2021-11-05 LAB — CMP14+EGFR
ALT: 18 IU/L (ref 0–32)
AST: 22 IU/L (ref 0–40)
Albumin/Globulin Ratio: 2.4 — ABNORMAL HIGH (ref 1.2–2.2)
Albumin: 4 g/dL (ref 3.8–4.9)
Alkaline Phosphatase: 45 IU/L (ref 44–121)
BUN/Creatinine Ratio: 17 (ref 9–23)
BUN: 12 mg/dL (ref 6–24)
Bilirubin Total: 0.9 mg/dL (ref 0.0–1.2)
CO2: 23 mmol/L (ref 20–29)
Calcium: 9.2 mg/dL (ref 8.7–10.2)
Chloride: 107 mmol/L — ABNORMAL HIGH (ref 96–106)
Creatinine, Ser: 0.69 mg/dL (ref 0.57–1.00)
Globulin, Total: 1.7 g/dL (ref 1.5–4.5)
Glucose: 92 mg/dL (ref 70–99)
Potassium: 4.4 mmol/L (ref 3.5–5.2)
Sodium: 143 mmol/L (ref 134–144)
Total Protein: 5.7 g/dL — ABNORMAL LOW (ref 6.0–8.5)
eGFR: 100 mL/min/{1.73_m2} (ref >59)

## 2021-11-05 LAB — LIPID PANEL
Chol/HDL Ratio: 3.8 ratio (ref 0.0–4.4)
Cholesterol, Total: 229 mg/dL — ABNORMAL HIGH (ref 100–199)
HDL: 61 mg/dL (ref >39)
LDL Chol Calc (NIH): 152 mg/dL — ABNORMAL HIGH (ref 0–99)
Triglycerides: 92 mg/dL (ref 0–149)
VLDL Cholesterol Cal: 16 mg/dL (ref 5–40)

## 2021-11-05 LAB — VITAMIN D 25 HYDROXY (VIT D DEFICIENCY, FRACTURES): Vit D, 25-Hydroxy: 40.6 ng/mL (ref 30.0–100.0)

## 2021-11-18 ENCOUNTER — Other Ambulatory Visit (HOSPITAL_COMMUNITY): Payer: Self-pay

## 2021-11-18 MED ORDER — VITAMIN D (ERGOCALCIFEROL) 1.25 MG (50000 UNIT) PO CAPS
ORAL_CAPSULE | ORAL | 1 refills | Status: AC
  Filled 2021-11-18 – 2022-03-17 (×2): qty 24, 84d supply, fill #0
  Filled 2022-06-23: qty 24, 84d supply, fill #1

## 2021-11-26 ENCOUNTER — Encounter: Payer: Self-pay | Admitting: Nurse Practitioner

## 2021-11-26 ENCOUNTER — Other Ambulatory Visit (HOSPITAL_COMMUNITY): Payer: Self-pay

## 2021-12-11 ENCOUNTER — Telehealth (INDEPENDENT_AMBULATORY_CARE_PROVIDER_SITE_OTHER): Payer: 59 | Admitting: Nurse Practitioner

## 2021-12-11 ENCOUNTER — Encounter: Payer: Self-pay | Admitting: Nurse Practitioner

## 2021-12-11 ENCOUNTER — Ambulatory Visit
Admission: RE | Admit: 2021-12-11 | Discharge: 2021-12-11 | Disposition: A | Discharge status: Home / Self Care | Payer: 59 | Source: Ambulatory Visit | Attending: Nurse Practitioner | Admitting: Nurse Practitioner

## 2021-12-11 ENCOUNTER — Other Ambulatory Visit (HOSPITAL_COMMUNITY): Payer: Self-pay

## 2021-12-11 VITALS — Temp 100.0°F

## 2021-12-11 DIAGNOSIS — R519 Headache, unspecified: Secondary | ICD-10-CM

## 2021-12-11 DIAGNOSIS — R509 Fever, unspecified: Secondary | ICD-10-CM | POA: Diagnosis not present

## 2021-12-11 LAB — CBC WITH DIFFERENTIAL/PLATELET
Basophils Absolute: 0.1 10*3/uL (ref 0.0–0.2)
Basos: 1 %
EOS (ABSOLUTE): 0.2 10*3/uL (ref 0.0–0.4)
Eos: 1 %
Hematocrit: 34.8 % (ref 34.0–46.6)
Hemoglobin: 11.6 g/dL (ref 11.1–15.9)
Immature Grans (Abs): 0.7 10*3/uL — ABNORMAL HIGH (ref 0.0–0.1)
Immature Granulocytes: 5 %
Lymphocytes Absolute: 2.6 10*3/uL (ref 0.7–3.1)
Lymphs: 17 %
MCH: 29.4 pg (ref 26.6–33.0)
MCHC: 33.3 g/dL (ref 31.5–35.7)
MCV: 88 fL (ref 79–97)
Monocytes Absolute: 1.6 10*3/uL — ABNORMAL HIGH (ref 0.1–0.9)
Monocytes: 10 %
Neutrophils Absolute: 10.6 10*3/uL — ABNORMAL HIGH (ref 1.4–7.0)
Neutrophils: 66 %
Platelets: 333 10*3/uL (ref 150–450)
RBC: 3.94 x10E6/uL (ref 3.77–5.28)
RDW: 12.4 % (ref 11.7–15.4)
WBC: 15.8 10*3/uL — ABNORMAL HIGH (ref 3.4–10.8)

## 2021-12-11 MED ORDER — PREDNISONE 10 MG (21) PO TBPK
ORAL_TABLET | ORAL | 0 refills | Status: DC
  Filled 2021-12-11: qty 21, 6d supply, fill #0

## 2021-12-11 MED ORDER — AMOXICILLIN-POT CLAVULANATE 875-125 MG PO TABS
1.0000 | ORAL_TABLET | Freq: Two times a day (BID) | ORAL | 0 refills | Status: AC
  Filled 2021-12-11: qty 14, 7d supply, fill #0

## 2021-12-11 NOTE — Progress Notes (Signed)
Virtual Visit via Mychart   This visit type was conducted due to national recommendations for restrictions regarding the COVID-19 Pandemic (e.g. social distancing) in an effort to limit this patient's exposure and mitigate transmission in our community.  Due to her co-morbid illnesses, this patient is at least at moderate risk for complications without adequate follow up.  This format is felt to be most appropriate for this patient at this time.  All issues noted in this document were discussed and addressed.  A limited physical exam was performed with this format.    This visit type was conducted due to national recommendations for restrictions regarding the COVID-19 Pandemic (e.g. social distancing) in an effort to limit this patient's exposure and mitigate transmission in our community.  Patients identity confirmed using two different identifiers.  This format is felt to be most appropriate for this patient at this time.  All issues noted in this document were discussed and addressed.  No physical exam was performed (except for noted visual exam findings with Video Visits).    Date:  01/13/2022   ID:  Jade Owen, DOB [birth year], MRN [id number]  Patient Location:  Home - spoke with Jade Owen  Provider location:   Office    Chief Complaint:  fever and headache  History of Present Illness:    Jade Owen is a 61 y.o. female who presents via video conferencing for a telehealth visit today.    The patient does have symptoms concerning for COVID-19 infection (fever, chills, cough, or new shortness of breath).   Patient presents for treatment of fever and headache since last Thursday - was up to 103.  Headache and fever has been non-stop. She took a covid test yesterday which was negative. She has been taking tylenol and ibuprofen. Helps for a little while then does not. She has a decreased appetite.   Fever  This is a new problem. The current episode started in the past 7  days. The problem occurs intermittently. The problem has been gradually improving. The maximum temperature noted was 100 to 100.9 F. Associated symptoms include coughing and headaches. Pertinent negatives include no nausea. She has tried acetaminophen and fluids for the symptoms. The treatment provided no relief.  Headache  Associated symptoms include coughing and a fever. Pertinent negatives include no nausea.    Past Medical History:  Diagnosis Date   Acid reflux    Chest pain of uncertain etiology 01/13/4131   Pure hypercholesterolemia 09/20/2018   History reviewed. No pertinent surgical history.   Current Meds  Medication Sig   [EXPIRED] amoxicillin-clavulanate (AUGMENTIN) 875-125 MG tablet Take 1 tablet by mouth 2 (two) times daily for 7 days.   dexlansoprazole (DEXILANT) 60 MG capsule Take 1 capsule (60 mg total) by mouth daily.   metoprolol tartrate (LOPRESSOR) 50 MG tablet Take 1 tablet (50 mg total) by mouth 2 (two) times daily.   ondansetron (ZOFRAN-ODT) 8 MG disintegrating tablet Dissolve 1 tablet (8 mg total) by mouth every 8 (eight) hours as needed for nausea or vomiting.   predniSONE (STERAPRED UNI-PAK 21 TAB) 10 MG (21) TBPK tablet Take as directed   Vitamin D, Ergocalciferol, (DRISDOL) 1.25 MG (50000 UNIT) CAPS capsule TAKE 1 CAPSULE BY MOUTH 2 TIMES A WEEK.     Allergies:   Patient has no known allergies.   Social History   Tobacco Use   Smoking status: Never   Smokeless tobacco: Never  Substance Use Topics   Alcohol use: No  Drug use: No     Family Hx: The patient's family history includes Heart attack in her cousin, maternal aunt, and paternal uncle; Heart disease in her sister; Heart failure in her father. There is no history of Breast cancer.  ROS:   Please see the history of present illness.    Review of Systems  Constitutional:  Positive for chills and fever. Negative for malaise/fatigue.       Decreased appetite  Respiratory:  Positive for cough.    Cardiovascular: Negative.   Gastrointestinal: Negative.  Negative for nausea.  Neurological:  Positive for headaches.  Psychiatric/Behavioral: Negative.     All other systems reviewed and are negative.   Labs/Other Tests and Data Reviewed:    Recent Labs: 03/22/2021: Magnesium 1.8; TSH 1.220 11/04/2021: ALT 18; BUN 12; Creatinine, Ser 0.69; Potassium 4.4; Sodium 143 12/11/2021: Hemoglobin 11.6; Platelets 333   Recent Lipid Panel Lab Results  Component Value Date/Time   CHOL 229 (H) 11/04/2021 09:14 AM   TRIG 92 11/04/2021 09:14 AM   HDL 61 11/04/2021 09:14 AM   CHOLHDL 3.8 11/04/2021 09:14 AM   LDLCALC 152 (H) 11/04/2021 09:14 AM    Wt Readings from Last 3 Encounters:  11/04/21 166 lb 9.6 oz (75.6 kg)  05/06/21 164 lb (74.4 kg)  05/03/21 164 lb (74.4 kg)     Exam:    Vital Signs:  Temp 100 F (37.8 C) (Oral)     Physical Exam Constitutional:      General: She is not in acute distress.    Appearance: Normal appearance.  Pulmonary:     Effort: Pulmonary effort is normal. No respiratory distress.  Neurological:     General: No focal deficit present.     Mental Status: She is alert and oriented to person, place, and time. Mental status is at baseline.     Cranial Nerves: No cranial nerve deficit.  Psychiatric:        Mood and Affect: Mood and affect normal.        Behavior: Behavior normal.        Thought Content: Thought content normal.        Cognition and Memory: Memory normal.        Judgment: Judgment normal.    ASSESSMENT & PLAN:    1. Acute nonintractable headache, unspecified headache type Will treat with steroid to help break the cycle of the headache  - DG Chest 2 View; Future - predniSONE (STERAPRED UNI-PAK 21 TAB) 10 MG (21) TBPK tablet; Take as directed  Dispense: 21 tablet; Refill: 0 - CBC with Differential/Platelet  2. Fever, unspecified fever cause  - DG Chest 2 View; Future - amoxicillin-clavulanate (AUGMENTIN) 875-125 MG tablet; Take 1  tablet by mouth 2 (two) times daily for 7 days.  Dispense: 14 tablet; Refill: 0 - predniSONE (STERAPRED UNI-PAK 21 TAB) 10 MG (21) TBPK tablet; Take as directed  Dispense: 21 tablet; Refill: 0 - CBC with Differential/Platelet   COVID-19 Education: The signs and symptoms of COVID-19 were discussed with the patient and how to seek care for testing (follow up with PCP or arrange E-visit).  The importance of social distancing was discussed today.  Patient Risk:   After full review of this patients clinical status, I feel that they are at least moderate risk at this time.  Time:   Today, I have spent 10 minutes/ seconds with the patient with telehealth technology discussing above diagnoses.     Medication Adjustments/Labs and Tests Ordered: Current medicines  are reviewed at length with the patient today.  Concerns regarding medicines are outlined above.   Tests Ordered: Orders Placed This Encounter  Procedures   DG Chest 2 View   CBC with Differential/Platelet    Medication Changes: Meds ordered this encounter  Medications   amoxicillin-clavulanate (AUGMENTIN) 875-125 MG tablet    Sig: Take 1 tablet by mouth 2 (two) times daily for 7 days.    Dispense:  14 tablet    Refill:  0   predniSONE (STERAPRED UNI-PAK 21 TAB) 10 MG (21) TBPK tablet    Sig: Take as directed    Dispense:  21 tablet    Refill:  0    Disposition:  Follow up prn  Signed, Minette Brine, FNP

## 2021-12-11 NOTE — Patient Instructions (Signed)
General Headache Without Cause A headache is pain or discomfort you feel around the head or neck area. There are many causes and types of headaches. In some cases, the cause may not be found. Follow these instructions at home: Watch your condition for any changes. Let your doctor know about them. Take these steps to help with your condition: Managing pain     Take over-the-counter and prescription medicines only as told by your doctor. This includes medicines for pain that are taken by mouth or put on the skin. Lie down in a dark, quiet room when you have a headache. If told, put ice on your head and neck area: Put ice in a plastic bag. Place a towel between your skin and the bag. Leave the ice on for 20 minutes, 2-3 times per day. Take off the ice if your skin turns bright red. This is very important. If you cannot feel pain, heat, or cold, you have a greater risk of damage to the area. If told, put heat on the affected area. Use the heat source that your doctor recommends, such as a moist heat pack or a heating pad. Place a towel between your skin and the heat source. Leave the heat on for 20-30 minutes. Take off the heat if your skin turns bright red. This is very important. If you cannot feel pain, heat, or cold, you have a greater risk of getting burned. Keep lights dim if bright lights bother you or make your headaches worse. Eating and drinking Eat meals on a regular schedule. If you drink alcohol: Limit how much you have to: 0-1 drink a day for women who are not pregnant. 0-2 drinks a day for men. Know how much alcohol is in a drink. In the [place] one drink equals one 12 oz bottle of beer (355 mL), one 5 oz glass of wine (148 mL), or one 1 oz glass of hard liquor (44 mL). Stop drinking caffeine, or drink less caffeine. General instructions  Keep a journal to find out if certain things bring on headaches. For example, write down: What you eat and drink. How much sleep you  get. Any change to your diet or medicines. Get a massage or try other ways to relax. Limit stress. Sit up straight. Do not tighten (tense) your muscles. Do not smoke or use any products that contain nicotine or tobacco. If you need help quitting, ask your doctor. Exercise regularly as told by your doctor. Get enough sleep. This often means 7-9 hours of sleep each night. Keep all follow-up visits. This is important. Contact a doctor if: Medicine does not help your symptoms. You have a headache that feels different than the other headaches. You feel like you may vomit (nauseous) or you vomit. You have a fever. Get help right away if: Your headache: Gets very bad quickly. Gets worse after a lot of physical activity. You have any of these symptoms: You continue to vomit. A stiff neck. Trouble seeing. Your eye or ear hurts. Trouble speaking. Weak muscles or you lose muscle control. You lose your balance or have trouble walking. You feel like you will pass out (faint) or you pass out. You are mixed up (confused). You have a seizure. These symptoms may be an emergency. Get help right away. Call your local emergency services (911 in the [place] Do not wait to see if the symptoms will go away. Do not drive yourself to the hospital. Summary A headache is pain or discomfort that   is felt around the head or neck area. There are many causes and types of headaches. In some cases, the cause may not be found. Keep a journal to help find out what causes your headaches. Watch your condition for any changes. Let your doctor know about them. Contact a doctor if you have a headache that is different from usual, or if medicine does not help your headache. Get help right away if your headache gets very bad, you throw up, you have trouble seeing, you lose your balance, or you have a seizure. This information is not intended to replace advice given to you by your health care provider. Make sure you  discuss any questions you have with your health care provider. Document Revised: 01/02/2021 Document Reviewed: 01/02/2021 Elsevier Patient Education  2023 [employer]  

## 2021-12-12 ENCOUNTER — Other Ambulatory Visit: Payer: Self-pay | Admitting: Nurse Practitioner

## 2021-12-12 DIAGNOSIS — D72829 Elevated white blood cell count, unspecified: Secondary | ICD-10-CM

## 2021-12-12 DIAGNOSIS — R509 Fever, unspecified: Secondary | ICD-10-CM

## 2021-12-16 ENCOUNTER — Other Ambulatory Visit: Payer: Self-pay | Admitting: Family Medicine

## 2021-12-17 ENCOUNTER — Other Ambulatory Visit (HOSPITAL_COMMUNITY): Payer: Self-pay

## 2021-12-17 MED ORDER — ALLOPURINOL 300 MG PO TABS
ORAL_TABLET | Freq: Every day | ORAL | 1 refills | Status: DC
Start: 2021-12-17 — End: 2022-06-23
  Filled 2021-12-17: qty 90, 90d supply, fill #0
  Filled 2022-03-17: qty 90, 90d supply, fill #1

## 2022-03-17 ENCOUNTER — Other Ambulatory Visit (HOSPITAL_COMMUNITY): Payer: Self-pay

## 2022-03-17 ENCOUNTER — Other Ambulatory Visit: Payer: Self-pay | Admitting: Nurse Practitioner

## 2022-03-19 ENCOUNTER — Other Ambulatory Visit (HOSPITAL_COMMUNITY): Payer: Self-pay

## 2022-03-20 ENCOUNTER — Other Ambulatory Visit (HOSPITAL_COMMUNITY): Payer: Self-pay

## 2022-03-20 MED ORDER — PANTOPRAZOLE SODIUM 40 MG PO TBEC
DELAYED_RELEASE_TABLET | Freq: Every day | ORAL | 1 refills | Status: DC
Start: 2022-03-20 — End: 2023-07-06
  Filled 2022-03-20: qty 90, 90d supply, fill #0
  Filled 2022-06-23: qty 90, 90d supply, fill #1

## 2022-04-08 DIAGNOSIS — N841 Polyp of cervix uteri: Secondary | ICD-10-CM | POA: Diagnosis not present

## 2022-04-08 DIAGNOSIS — N950 Postmenopausal bleeding: Secondary | ICD-10-CM | POA: Diagnosis not present

## 2022-04-08 DIAGNOSIS — N760 Acute vaginitis: Secondary | ICD-10-CM | POA: Diagnosis not present

## 2022-04-09 ENCOUNTER — Other Ambulatory Visit (HOSPITAL_COMMUNITY): Payer: Self-pay

## 2022-04-09 DIAGNOSIS — N841 Polyp of cervix uteri: Secondary | ICD-10-CM | POA: Diagnosis not present

## 2022-04-09 DIAGNOSIS — N840 Polyp of corpus uteri: Secondary | ICD-10-CM | POA: Diagnosis not present

## 2022-04-09 MED ORDER — TINIDAZOLE 500 MG PO TABS
ORAL_TABLET | ORAL | 0 refills | Status: DC
  Filled 2022-04-09: qty 10, 5d supply, fill #0

## 2022-04-22 ENCOUNTER — Encounter: Payer: Self-pay | Admitting: Nurse Practitioner

## 2022-04-29 DIAGNOSIS — N840 Polyp of corpus uteri: Secondary | ICD-10-CM | POA: Diagnosis not present

## 2022-04-29 DIAGNOSIS — N950 Postmenopausal bleeding: Secondary | ICD-10-CM | POA: Diagnosis not present

## 2022-05-07 ENCOUNTER — Ambulatory Visit: Payer: 59 | Admitting: Nurse Practitioner

## 2022-05-09 ENCOUNTER — Encounter: Payer: Self-pay | Admitting: Nurse Practitioner

## 2022-06-23 ENCOUNTER — Other Ambulatory Visit: Payer: Self-pay | Admitting: Family Medicine

## 2022-06-23 ENCOUNTER — Other Ambulatory Visit (HOSPITAL_BASED_OUTPATIENT_CLINIC_OR_DEPARTMENT_OTHER): Payer: Self-pay | Admitting: Cardiovascular Disease

## 2022-06-23 ENCOUNTER — Other Ambulatory Visit: Payer: Self-pay | Admitting: Nurse Practitioner

## 2022-06-23 ENCOUNTER — Other Ambulatory Visit (HOSPITAL_COMMUNITY): Payer: Self-pay

## 2022-06-23 DIAGNOSIS — E782 Mixed hyperlipidemia: Secondary | ICD-10-CM

## 2022-06-23 DIAGNOSIS — R002 Palpitations: Secondary | ICD-10-CM

## 2022-06-23 MED ORDER — METOPROLOL TARTRATE 50 MG PO TABS
50.0000 mg | ORAL_TABLET | Freq: Two times a day (BID) | ORAL | 0 refills | Status: DC
  Filled 2022-06-23: qty 60, 30d supply, fill #0

## 2022-06-23 MED ORDER — ATORVASTATIN CALCIUM 10 MG PO TABS
10.0000 mg | ORAL_TABLET | Freq: Every day | ORAL | 1 refills | Status: DC
  Filled 2022-06-23: qty 90, 90d supply, fill #0

## 2022-06-23 MED ORDER — ALLOPURINOL 300 MG PO TABS
300.0000 mg | ORAL_TABLET | Freq: Every day | ORAL | 1 refills | Status: DC
Start: 2022-06-23 — End: 2022-11-12
  Filled 2022-06-23: qty 90, 90d supply, fill #0
  Filled 2022-09-24: qty 90, 90d supply, fill #1

## 2022-06-23 NOTE — Telephone Encounter (Signed)
Rx refill request approved per Dr. Corey's orders. 

## 2022-06-24 DIAGNOSIS — K219 Gastro-esophageal reflux disease without esophagitis: Secondary | ICD-10-CM | POA: Diagnosis not present

## 2022-06-24 DIAGNOSIS — K5900 Constipation, unspecified: Secondary | ICD-10-CM | POA: Diagnosis not present

## 2022-06-24 DIAGNOSIS — M109 Gout, unspecified: Secondary | ICD-10-CM | POA: Diagnosis not present

## 2022-06-24 DIAGNOSIS — Z1211 Encounter for screening for malignant neoplasm of colon: Secondary | ICD-10-CM | POA: Diagnosis not present

## 2022-06-24 DIAGNOSIS — Z800 Family history of malignant neoplasm of digestive organs: Secondary | ICD-10-CM | POA: Diagnosis not present

## 2022-06-24 DIAGNOSIS — E782 Mixed hyperlipidemia: Secondary | ICD-10-CM | POA: Diagnosis not present

## 2022-06-24 DIAGNOSIS — I10 Essential (primary) hypertension: Secondary | ICD-10-CM | POA: Diagnosis not present

## 2022-06-27 ENCOUNTER — Telehealth (HOSPITAL_BASED_OUTPATIENT_CLINIC_OR_DEPARTMENT_OTHER): Payer: Self-pay | Admitting: Cardiovascular Disease

## 2022-06-27 NOTE — Telephone Encounter (Signed)
Left message for patient to call and schedule over due follow up with Dr. [place]---to refill medications. °

## 2022-07-03 NOTE — Telephone Encounter (Signed)
Left message for patient to call and schedule over due follow up with Dr. [place]---to refill medications. °

## 2022-07-07 NOTE — Telephone Encounter (Signed)
Left message for patient to call and schedule overdue follow up with  Dr. Devona Konig, NP to refill medications

## 2022-07-22 NOTE — Telephone Encounter (Signed)
Left message for patient to call and schedule over due follow up with Dr. Devona Konig, NP to refill medications

## 2022-07-28 DIAGNOSIS — H40053 Ocular hypertension, bilateral: Secondary | ICD-10-CM | POA: Diagnosis not present

## 2022-07-28 DIAGNOSIS — H52203 Unspecified astigmatism, bilateral: Secondary | ICD-10-CM | POA: Diagnosis not present

## 2022-08-04 ENCOUNTER — Encounter (HOSPITAL_BASED_OUTPATIENT_CLINIC_OR_DEPARTMENT_OTHER): Payer: Self-pay | Admitting: Cardiovascular Disease

## 2022-08-04 ENCOUNTER — Telehealth (HOSPITAL_BASED_OUTPATIENT_CLINIC_OR_DEPARTMENT_OTHER): Payer: Self-pay | Admitting: Cardiovascular Disease

## 2022-08-04 DIAGNOSIS — Z118 Encounter for screening for other infectious and parasitic diseases: Secondary | ICD-10-CM | POA: Diagnosis not present

## 2022-08-04 DIAGNOSIS — Z01419 Encounter for gynecological examination (general) (routine) without abnormal findings: Secondary | ICD-10-CM | POA: Diagnosis not present

## 2022-08-04 DIAGNOSIS — Z6829 Body mass index (BMI) 29.0-29.9, adult: Secondary | ICD-10-CM | POA: Diagnosis not present

## 2022-08-04 NOTE — Telephone Encounter (Signed)
Left message 06/27/22--07/03/22--07/07/22--07/22/22 for patient to call and scheduled overdue follow up with Dr. Oval Linsey.  WIll mail letter to patient requesting she call to schedule

## 2022-10-06 ENCOUNTER — Other Ambulatory Visit: Payer: Self-pay | Admitting: Nurse Practitioner

## 2022-10-06 ENCOUNTER — Other Ambulatory Visit (HOSPITAL_BASED_OUTPATIENT_CLINIC_OR_DEPARTMENT_OTHER): Payer: Self-pay

## 2022-10-06 ENCOUNTER — Other Ambulatory Visit: Payer: Self-pay | Admitting: Family Medicine

## 2022-10-06 DIAGNOSIS — Z1231 Encounter for screening mammogram for malignant neoplasm of breast: Secondary | ICD-10-CM

## 2022-10-07 ENCOUNTER — Other Ambulatory Visit (HOSPITAL_BASED_OUTPATIENT_CLINIC_OR_DEPARTMENT_OTHER): Payer: Self-pay

## 2022-10-07 ENCOUNTER — Encounter (HOSPITAL_BASED_OUTPATIENT_CLINIC_OR_DEPARTMENT_OTHER): Payer: Self-pay | Admitting: Pharmacist

## 2022-10-17 DIAGNOSIS — H40013 Open angle with borderline findings, low risk, bilateral: Secondary | ICD-10-CM | POA: Diagnosis not present

## 2022-11-12 ENCOUNTER — Other Ambulatory Visit (HOSPITAL_COMMUNITY): Payer: Self-pay

## 2022-11-12 ENCOUNTER — Ambulatory Visit (INDEPENDENT_AMBULATORY_CARE_PROVIDER_SITE_OTHER): Payer: 59 | Admitting: Nurse Practitioner

## 2022-11-12 ENCOUNTER — Encounter: Payer: Self-pay | Admitting: Nurse Practitioner

## 2022-11-12 VITALS — BP 122/62 | HR 78 | Temp 98.6°F | Ht 62.0 in | Wt 159.0 lb

## 2022-11-12 DIAGNOSIS — E559 Vitamin D deficiency, unspecified: Secondary | ICD-10-CM

## 2022-11-12 DIAGNOSIS — Z6829 Body mass index (BMI) 29.0-29.9, adult: Secondary | ICD-10-CM | POA: Diagnosis not present

## 2022-11-12 DIAGNOSIS — Z0000 Encounter for general adult medical examination without abnormal findings: Secondary | ICD-10-CM | POA: Diagnosis not present

## 2022-11-12 DIAGNOSIS — Z13228 Encounter for screening for other metabolic disorders: Secondary | ICD-10-CM | POA: Diagnosis not present

## 2022-11-12 DIAGNOSIS — E782 Mixed hyperlipidemia: Secondary | ICD-10-CM | POA: Diagnosis not present

## 2022-11-12 DIAGNOSIS — Z800 Family history of malignant neoplasm of digestive organs: Secondary | ICD-10-CM | POA: Insufficient documentation

## 2022-11-12 DIAGNOSIS — M1A4620 Other secondary chronic gout, left knee, without tophus (tophi): Secondary | ICD-10-CM

## 2022-11-12 MED ORDER — COLCHICINE 0.6 MG PO TABS
0.6000 mg | ORAL_TABLET | Freq: Every day | ORAL | 1 refills | Status: AC
  Filled 2022-11-12: qty 90, 90d supply, fill #0

## 2022-11-12 MED ORDER — ALLOPURINOL 300 MG PO TABS
300.0000 mg | ORAL_TABLET | Freq: Every day | ORAL | 1 refills | Status: DC
  Filled 2022-11-12 – 2022-12-16 (×2): qty 90, 90d supply, fill #0
  Filled 2023-03-26: qty 90, 90d supply, fill #1

## 2022-11-12 MED ORDER — ATORVASTATIN CALCIUM 10 MG PO TABS
10.0000 mg | ORAL_TABLET | Freq: Every day | ORAL | 1 refills | Status: DC
  Filled 2022-11-12: qty 90, 90d supply, fill #0
  Filled 2023-07-06: qty 90, 90d supply, fill #1

## 2022-11-12 NOTE — Progress Notes (Signed)
I,Jade Owen,acting as a Neurosurgeonscribe for Jade FeltsJanece Travia Onstad, FNP.,have documented all relevant documentation on the behalf of Jade FeltsJanece Mehdi Gironda, FNP,as directed by  Jade FeltsJanece Zameer Borman, FNP while in the presence of Jade FeltsJanece Hildegard Hlavac, FNP.   Subjective:     Patient ID: Jade Owen , female    DOB: 06-Nov-1960 , 62 y.o.   MRN: [id number]   Chief Complaint  Patient presents with   Annual Exam    HPI  Patient presents today for annual exam. Patient would like prescriptions for Allopurinol and Colchicine, she was previously receiving these from [facility].  She is having left knee pain which she feels like she is having a flare.   Wt Readings from Last 3 Encounters: 11/12/22 : 159 lb (72.1 kg) 11/04/21 : 166 lb 9.6 oz (75.6 kg) 05/06/21 : 164 lb (74.4 kg)  The 10-year ASCVD risk score (Arnett DK, et al., 2019) is: 6.3%   Values used to calculate the score:     Age: [age] years     Sex: Female     Is Non-Hispanic African American: Yes     Diabetic: No     Tobacco smoker: No     Systolic Blood Pressure: 122 mmHg     Is BP treated: Yes     HDL Cholesterol: 61 mg/dL     Total Cholesterol: 229 mg/dL      Past Medical History:  Diagnosis Date   Acid reflux    Chest pain of uncertain etiology 02/27/2021   Pure hypercholesterolemia 09/20/2018     Family History  Problem Relation Age of Onset   Heart failure Father    Heart disease Sister    Heart attack Maternal Aunt    Heart attack Paternal Uncle    Heart attack Cousin    Breast cancer Neg Hx      Current Outpatient Medications:    colchicine 0.6 MG tablet, Take 1 tablet (0.6 mg total) by mouth daily., Disp: 90 tablet, Rfl: 1   metoprolol tartrate (LOPRESSOR) 50 MG tablet, Take 1 tablet (50 mg total) by mouth 2 (two) times daily. PATIENT NEEDS APPOINTMENT, Disp: 60 tablet, Rfl: 0   ondansetron (ZOFRAN-ODT) 8 MG disintegrating tablet, Dissolve 1 tablet (8 mg total) by mouth every 8 (eight) hours as needed for nausea or  vomiting., Disp: 30 tablet, Rfl: 0   pantoprazole (PROTONIX) 40 MG tablet, TAKE 1 TABLET BY MOUTH DAILY., Disp: 90 tablet, Rfl: 1   tinidazole (TINDAMAX) 500 MG tablet, Take 2 tablets by mouth every day as directed for 5 days., Disp: 10 tablet, Rfl: 0   allopurinol (ZYLOPRIM) 300 MG tablet, Take 1 tablet (300 mg total) by mouth daily., Disp: 90 tablet, Rfl: 1   atorvastatin (LIPITOR) 10 MG tablet, Take 1 tablet (10 mg total) by mouth daily., Disp: 90 tablet, Rfl: 1   No Known Allergies    The patient states she uses post menopausal status.  No LMP recorded. Patient is postmenopausal.  Negative for Dysmenorrhea and Negative for Menorrhagia. Negative for: breast discharge, breast lump(s), breast pain and breast self exam. Associated symptoms include abnormal vaginal bleeding. Pertinent negatives include abnormal bleeding (hematology), anxiety, decreased libido, depression, difficulty falling sleep, dyspareunia, history of infertility, nocturia, sexual dysfunction, sleep disturbances, urinary incontinence, urinary urgency, vaginal discharge and vaginal itching. Diet regular.  The patient states her exercise level is moderate - 1 hour with walking to 30 minutes of other exercises.   The patient's tobacco use is:  Social History  Tobacco Use  Smoking Status Never  Smokeless Tobacco Never  . She has been exposed to passive smoke. The patient's alcohol use is:  Social History   Substance and Sexual Activity  Alcohol Use No  . Additional information: Last pap 03/13/2021, next one scheduled for 03/13/2024.    Review of Systems  Constitutional: Negative.   HENT: Negative.    Eyes: Negative.   Respiratory: Negative.    Cardiovascular: Negative.  Negative for chest pain, palpitations and leg swelling.  Gastrointestinal: Negative.   Endocrine: Negative.   Genitourinary: Negative.   Musculoskeletal: Negative.   Skin: Negative.   Allergic/Immunologic: Negative.   Neurological: Negative.    Hematological: Negative.   Psychiatric/Behavioral: Negative.       Today's Vitals   11/12/22 0904  BP: 122/62  Pulse: 78  Temp: 98.6 F (37 C)  TempSrc: Oral  SpO2: 98%  Weight: 159 lb (72.1 kg)  Height: 5\' 2"  (1.575 m)   Body mass index is 29.08 kg/m.   Objective:  Physical Exam Vitals reviewed.  Constitutional:      General: She is not in acute distress.    Appearance: Normal appearance. She is well-developed and normal weight.  HENT:     Head: Normocephalic and atraumatic.     Right Ear: Hearing, tympanic membrane, ear canal and external ear normal. There is no impacted cerumen.     Left Ear: Hearing, tympanic membrane, ear canal and external ear normal. There is no impacted cerumen.     Nose:     Comments: Deferred - masked    Mouth/Throat:     Comments: Deferred - masked Eyes:     General: Lids are normal.     Extraocular Movements: Extraocular movements intact.     Conjunctiva/sclera: Conjunctivae normal.     Pupils: Pupils are equal, round, and reactive to light.     Funduscopic exam:    Right eye: No papilledema.        Left eye: No papilledema.  Neck:     Thyroid: No thyroid mass.     Vascular: No carotid bruit.  Cardiovascular:     Rate and Rhythm: Normal rate and regular rhythm.     Pulses: Normal pulses.     Heart sounds: Normal heart sounds. No murmur heard. Pulmonary:     Effort: Pulmonary effort is normal.     Breath sounds: Normal breath sounds.  Chest:     Chest wall: No mass.  Breasts:    Tanner Score is 5.     Right: Normal. No mass or tenderness.     Left: Normal. No mass or tenderness.  Abdominal:     General: Abdomen is flat. Bowel sounds are normal. There is no distension.     Palpations: Abdomen is soft.     Tenderness: There is no abdominal tenderness.  Genitourinary:    Comments: Deferred - seen by GYN.  Musculoskeletal:        General: No swelling. Normal range of motion.     Cervical back: Full passive range of motion  without pain, normal range of motion and neck supple.     Right lower leg: No edema.     Left lower leg: No edema.  Lymphadenopathy:     Upper Body:     Right upper body: No supraclavicular, axillary or pectoral adenopathy.     Left upper body: No supraclavicular, axillary or pectoral adenopathy.  Skin:    General: Skin is warm and dry.  Capillary Refill: Capillary refill takes less than 2 seconds.  Neurological:     General: No focal deficit present.     Mental Status: She is alert and oriented to person, place, and time.     Cranial Nerves: No cranial nerve deficit.     Sensory: No sensory deficit.  Psychiatric:        Mood and Affect: Mood normal.        Behavior: Behavior normal.        Thought Content: Thought content normal.        Judgment: Judgment normal.         Assessment And Plan:     1. Encounter for health maintenance examination Behavior modifications discussed and diet history reviewed.   Pt will continue to exercise regularly and modify diet with low GI, plant based foods and decrease intake of processed foods.  Recommend intake of daily multivitamin, Vitamin D, and calcium.  Recommend mammogram and colonoscopy for preventive screenings, as well as recommend immunizations that include influenza, TDAP, and Shingles  2. BMI 29.0-29.9,adult Comments: Congratulated on 7 lb weight loss continue exercising regularly and eating a healthy diet.  3. Vitamin D deficiency Will check vitamin D level and supplement as needed.    Also encouraged to spend 15 minutes in the sun daily.  - VITAMIN D 25 Hydroxy (Vit-D Deficiency, Fractures)  4. Mixed hyperlipidemia Comments: Continue statin, tolerating well. ASCVD risk score is 6.3% - CMP14+EGFR - Lipid panel - atorvastatin (LIPITOR) 10 MG tablet; Take 1 tablet (10 mg total) by mouth daily.  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 Will check Uric acid levels as well - CBC - colchicine 0.6 MG tablet; Take 1 tablet (0.6 mg total) by mouth daily.  Dispense: 90 tablet; Refill: 1 - allopurinol (ZYLOPRIM) 300 MG tablet; Take 1 tablet (300 mg total) by mouth daily.  Dispense: 90 tablet; Refill: 1 - Uric acid  6. Encounter for screening for metabolic disorder - Hemoglobin A1c   Patient was given opportunity to ask questions. Patient verbalized understanding of the plan and was able to repeat key elements of the plan. All questions were answered to their satisfaction.   Jade FeltsJanece Jayquan Bradsher, FNP   I, Jade FeltsJanece Derron Pipkins, FNP, have reviewed all documentation for this visit. The documentation on 11/12/22 for the exam, diagnosis, procedures, and orders are all accurate and complete.   THE PATIENT IS ENCOURAGED TO PRACTICE SOCIAL DISTANCING DUE TO THE COVID-19 PANDEMIC.

## 2022-11-13 LAB — HEMOGLOBIN A1C
Est. average glucose Bld gHb Est-mCnc: 117 mg/dL
Hgb A1c MFr Bld: 5.7 % — ABNORMAL HIGH (ref 4.8–5.6)

## 2022-11-13 LAB — CMP14+EGFR
ALT: 16 IU/L (ref 0–32)
AST: 15 IU/L (ref 0–40)
Albumin/Globulin Ratio: 2 (ref 1.2–2.2)
Albumin: 3.8 g/dL (ref 3.8–4.9)
Alkaline Phosphatase: 49 IU/L (ref 44–121)
BUN/Creatinine Ratio: 17 (ref 12–28)
BUN: 13 mg/dL (ref 8–27)
Bilirubin Total: 0.8 mg/dL (ref 0.0–1.2)
CO2: 23 mmol/L (ref 20–29)
Calcium: 9.3 mg/dL (ref 8.7–10.3)
Chloride: 104 mmol/L (ref 96–106)
Creatinine, Ser: 0.78 mg/dL (ref 0.57–1.00)
Globulin, Total: 1.9 g/dL (ref 1.5–4.5)
Glucose: 87 mg/dL (ref 70–99)
Potassium: 4.4 mmol/L (ref 3.5–5.2)
Sodium: 143 mmol/L (ref 134–144)
Total Protein: 5.7 g/dL — ABNORMAL LOW (ref 6.0–8.5)
eGFR: 87 mL/min/{1.73_m2} (ref >59)

## 2022-11-13 LAB — LIPID PANEL
Chol/HDL Ratio: 3.2 ratio (ref 0.0–4.4)
Cholesterol, Total: 192 mg/dL (ref 100–199)
HDL: 60 mg/dL (ref >39)
LDL Chol Calc (NIH): 117 mg/dL — ABNORMAL HIGH (ref 0–99)
Triglycerides: 81 mg/dL (ref 0–149)
VLDL Cholesterol Cal: 15 mg/dL (ref 5–40)

## 2022-11-13 LAB — CBC
Hematocrit: 43 % (ref 34.0–46.6)
Hemoglobin: 14 g/dL (ref 11.1–15.9)
MCH: 29.9 pg (ref 26.6–33.0)
MCHC: 32.6 g/dL (ref 31.5–35.7)
MCV: 92 fL (ref 79–97)
Platelets: 288 10*3/uL (ref 150–450)
RBC: 4.69 x10E6/uL (ref 3.77–5.28)
RDW: 13 % (ref 11.7–15.4)
WBC: 7.1 10*3/uL (ref 3.4–10.8)

## 2022-11-13 LAB — URIC ACID: Uric Acid: 2.1 mg/dL — ABNORMAL LOW (ref 3.0–7.2)

## 2022-11-13 LAB — VITAMIN D 25 HYDROXY (VIT D DEFICIENCY, FRACTURES): Vit D, 25-Hydroxy: 63.1 ng/mL (ref 30.0–100.0)

## 2022-11-14 ENCOUNTER — Other Ambulatory Visit (HOSPITAL_COMMUNITY): Payer: Self-pay

## 2022-11-23 ENCOUNTER — Encounter: Payer: Self-pay | Admitting: Nurse Practitioner

## 2022-12-03 ENCOUNTER — Other Ambulatory Visit (HOSPITAL_COMMUNITY): Payer: Self-pay

## 2022-12-03 MED ORDER — GOLYTELY 236 G PO SOLR
ORAL | 0 refills | Status: AC
  Filled 2022-12-03: qty 4000, 1d supply, fill #0

## 2022-12-05 ENCOUNTER — Other Ambulatory Visit (HOSPITAL_COMMUNITY): Payer: Self-pay

## 2022-12-15 DIAGNOSIS — D124 Benign neoplasm of descending colon: Secondary | ICD-10-CM | POA: Diagnosis not present

## 2022-12-15 DIAGNOSIS — Z1211 Encounter for screening for malignant neoplasm of colon: Secondary | ICD-10-CM | POA: Diagnosis not present

## 2022-12-15 DIAGNOSIS — K635 Polyp of colon: Secondary | ICD-10-CM | POA: Diagnosis not present

## 2022-12-15 DIAGNOSIS — K6389 Other specified diseases of intestine: Secondary | ICD-10-CM | POA: Diagnosis not present

## 2022-12-15 DIAGNOSIS — D125 Benign neoplasm of sigmoid colon: Secondary | ICD-10-CM | POA: Diagnosis not present

## 2022-12-15 DIAGNOSIS — Z800 Family history of malignant neoplasm of digestive organs: Secondary | ICD-10-CM | POA: Diagnosis not present

## 2022-12-15 LAB — HM COLONOSCOPY

## 2022-12-16 ENCOUNTER — Other Ambulatory Visit: Payer: Self-pay

## 2022-12-18 ENCOUNTER — Other Ambulatory Visit (HOSPITAL_COMMUNITY): Payer: Self-pay

## 2022-12-19 ENCOUNTER — Encounter: Payer: Self-pay | Admitting: Nurse Practitioner

## 2023-01-13 ENCOUNTER — Other Ambulatory Visit (HOSPITAL_COMMUNITY): Payer: Self-pay

## 2023-01-28 ENCOUNTER — Ambulatory Visit
Admission: RE | Admit: 2023-01-28 | Discharge: 2023-01-28 | Disposition: A | Discharge status: Home / Self Care | Payer: 59 | Source: Ambulatory Visit | Attending: Nurse Practitioner | Admitting: Nurse Practitioner

## 2023-01-28 DIAGNOSIS — Z1231 Encounter for screening mammogram for malignant neoplasm of breast: Secondary | ICD-10-CM

## 2023-02-12 ENCOUNTER — Ambulatory Visit: Payer: Commercial Managed Care - PPO

## 2023-03-27 ENCOUNTER — Other Ambulatory Visit (HOSPITAL_COMMUNITY): Payer: Self-pay

## 2023-04-14 DIAGNOSIS — N930 Postcoital and contact bleeding: Secondary | ICD-10-CM | POA: Diagnosis not present

## 2023-04-17 DIAGNOSIS — N930 Postcoital and contact bleeding: Secondary | ICD-10-CM | POA: Diagnosis not present

## 2023-04-17 DIAGNOSIS — N950 Postmenopausal bleeding: Secondary | ICD-10-CM | POA: Diagnosis not present

## 2023-07-06 ENCOUNTER — Other Ambulatory Visit: Payer: Self-pay | Admitting: Nurse Practitioner

## 2023-07-06 ENCOUNTER — Other Ambulatory Visit (HOSPITAL_COMMUNITY): Payer: Self-pay

## 2023-07-06 DIAGNOSIS — M1A4620 Other secondary chronic gout, left knee, without tophus (tophi): Secondary | ICD-10-CM

## 2023-07-06 MED ORDER — PANTOPRAZOLE SODIUM 40 MG PO TBEC
40.0000 mg | DELAYED_RELEASE_TABLET | Freq: Every day | ORAL | 1 refills | Status: DC
  Filled 2023-07-06: qty 90, 90d supply, fill #0
  Filled 2024-01-05: qty 90, 90d supply, fill #1

## 2023-07-06 MED ORDER — ALLOPURINOL 300 MG PO TABS
300.0000 mg | ORAL_TABLET | Freq: Every day | ORAL | 1 refills | Status: DC
  Filled 2023-07-06: qty 90, 90d supply, fill #0
  Filled 2023-10-07: qty 90, 90d supply, fill #1

## 2023-07-07 ENCOUNTER — Other Ambulatory Visit (HOSPITAL_COMMUNITY): Payer: Self-pay

## 2023-07-24 DIAGNOSIS — H52203 Unspecified astigmatism, bilateral: Secondary | ICD-10-CM | POA: Diagnosis not present

## 2023-07-24 DIAGNOSIS — H40053 Ocular hypertension, bilateral: Secondary | ICD-10-CM | POA: Diagnosis not present

## 2023-07-30 ENCOUNTER — Encounter: Payer: Self-pay | Admitting: Nurse Practitioner

## 2023-11-17 NOTE — Progress Notes (Unsigned)
 Madelaine Bhat, CMA,acting as a Neurosurgeon for Arnette Felts, FNP.,have documented all relevant documentation on the behalf of Arnette Felts, FNP,as directed by  Arnette Felts, FNP while in the presence of Arnette Felts, FNP.  Subjective:    Patient ID: Jade Owen , female    DOB: February 14, 1961 , 63 y.o.   MRN: [id number]  No chief complaint on file.   HPI  Patient presents today for HM, Patient reports compliance with medication. Patient denies any chest pain, SOB, or headaches. Patient has no concerns today.     Past Medical History:  Diagnosis Date  . Acid reflux   . Chest pain of uncertain etiology 02/27/2021  . Pure hypercholesterolemia 09/20/2018     Family History  Problem Relation Age of Onset  . Breast cancer Mother   . Heart failure Father   . Heart disease Sister   . Heart attack Maternal Aunt   . Heart attack Paternal Uncle   . Heart attack Cousin      Current Outpatient Medications:  .  allopurinol (ZYLOPRIM) 300 MG tablet, Take 1 tablet (300 mg total) by mouth daily., Disp: 90 tablet, Rfl: 1 .  atorvastatin (LIPITOR) 10 MG tablet, Take 1 tablet (10 mg total) by mouth daily., Disp: 90 tablet, Rfl: 1 .  colchicine 0.6 MG tablet, Take 1 tablet (0.6 mg total) by mouth daily., Disp: 90 tablet, Rfl: 1 .  metoprolol tartrate (LOPRESSOR) 50 MG tablet, Take 1 tablet (50 mg total) by mouth 2 (two) times daily. PATIENT NEEDS APPOINTMENT, Disp: 60 tablet, Rfl: 0 .  ondansetron (ZOFRAN-ODT) 8 MG disintegrating tablet, Dissolve 1 tablet (8 mg total) by mouth every 8 (eight) hours as needed for nausea or vomiting., Disp: 30 tablet, Rfl: 0 .  pantoprazole (PROTONIX) 40 MG tablet, Take 1 tablet (40 mg total) by mouth daily., Disp: 90 tablet, Rfl: 1 .  polyethylene glycol (GOLYTELY) 236 g solution, Take as directed per package instructions., Disp: 4000 mL, Rfl: 0 .  tinidazole (TINDAMAX) 500 MG tablet, Take 2 tablets by mouth every day as directed for 5 days., Disp: 10 tablet, Rfl: 0    No Known Allergies    The patient states she uses {contraceptive methods:5051} for birth control. No LMP recorded. Patient is postmenopausal.. {Dysmenorrhea-menorrhagia:21918}. Negative for: breast discharge, breast lump(s), breast pain and breast self exam. Associated symptoms include abnormal vaginal bleeding. Pertinent negatives include abnormal bleeding (hematology), anxiety, decreased libido, depression, difficulty falling sleep, dyspareunia, history of infertility, nocturia, sexual dysfunction, sleep disturbances, urinary incontinence, urinary urgency, vaginal discharge and vaginal itching. Diet regular.The patient states her exercise level is    . The patient's tobacco use is:  Social History   Tobacco Use  Smoking Status Never  Smokeless Tobacco Never  . She has been exposed to passive smoke. The patient's alcohol use is:  Social History   Substance and Sexual Activity  Alcohol Use No  . Additional information: Last pap ***, next one scheduled for ***.    Review of Systems   There were no vitals filed for this visit. There is no height or weight on file to calculate BMI.  Wt Readings from Last 3 Encounters:  11/12/22 159 lb (72.1 kg)  11/04/21 166 lb 9.6 oz (75.6 kg)  05/06/21 164 lb (74.4 kg)     Objective:  Physical Exam      Assessment And Plan:     Encounter for annual health examination  Mixed hyperlipidemia  Vitamin D deficiency  Abnormal glucose  No follow-ups on file. Patient was given opportunity to ask questions. Patient verbalized understanding of the plan and was able to repeat key elements of the plan. All questions were answered to their satisfaction.   Arnette Felts, FNP  I, Arnette Felts, FNP, have reviewed all documentation for this visit. The documentation on 11/17/23 for the exam, diagnosis, procedures, and orders are all accurate and complete.

## 2023-11-18 ENCOUNTER — Ambulatory Visit (INDEPENDENT_AMBULATORY_CARE_PROVIDER_SITE_OTHER): Payer: 59 | Admitting: Nurse Practitioner

## 2023-11-18 ENCOUNTER — Other Ambulatory Visit (HOSPITAL_COMMUNITY): Payer: Self-pay

## 2023-11-18 ENCOUNTER — Encounter: Payer: 59 | Admitting: Nurse Practitioner

## 2023-11-18 ENCOUNTER — Encounter: Payer: Self-pay | Admitting: Nurse Practitioner

## 2023-11-18 VITALS — BP 110/80 | HR 75 | Temp 98.6°F | Ht 62.0 in | Wt 163.0 lb

## 2023-11-18 DIAGNOSIS — E782 Mixed hyperlipidemia: Secondary | ICD-10-CM | POA: Diagnosis not present

## 2023-11-18 DIAGNOSIS — Z2821 Immunization not carried out because of patient refusal: Secondary | ICD-10-CM | POA: Insufficient documentation

## 2023-11-18 DIAGNOSIS — Z79899 Other long term (current) drug therapy: Secondary | ICD-10-CM

## 2023-11-18 DIAGNOSIS — G8929 Other chronic pain: Secondary | ICD-10-CM | POA: Diagnosis not present

## 2023-11-18 DIAGNOSIS — Z6829 Body mass index (BMI) 29.0-29.9, adult: Secondary | ICD-10-CM | POA: Insufficient documentation

## 2023-11-18 DIAGNOSIS — Z0000 Encounter for general adult medical examination without abnormal findings: Secondary | ICD-10-CM | POA: Diagnosis not present

## 2023-11-18 DIAGNOSIS — M25562 Pain in left knee: Secondary | ICD-10-CM | POA: Diagnosis not present

## 2023-11-18 DIAGNOSIS — R7309 Other abnormal glucose: Secondary | ICD-10-CM

## 2023-11-18 DIAGNOSIS — E559 Vitamin D deficiency, unspecified: Secondary | ICD-10-CM

## 2023-11-18 MED ORDER — PREDNISONE 5 MG (21) PO TBPK
ORAL_TABLET | ORAL | 0 refills | Status: AC
Start: 2023-11-18 — End: ?
  Filled 2023-11-18: qty 21, 6d supply, fill #0

## 2023-11-18 MED ORDER — NABUMETONE 500 MG PO TABS
500.0000 mg | ORAL_TABLET | Freq: Every day | ORAL | 2 refills | Status: AC
  Filled 2023-11-18: qty 30, 30d supply, fill #0
  Filled 2024-02-17: qty 30, 30d supply, fill #1

## 2023-11-18 NOTE — Assessment & Plan Note (Signed)
 Cholesterol levels were slightly improved at last visit. Continue focusing on low fat diet

## 2023-11-18 NOTE — Assessment & Plan Note (Signed)
 She does have crepitus noted with decreased range of motion. Long discussion about how her knee pain is affecting her daily lifestyle and to limit the amount of NSAIDs she is taking due to risk for bleeding or kidney damage. Will send long acting NSAID which may have better benefit. She will also call Dr. Katrinka Blazing (Sports Medicine) to f/u since it has been 2 years to see if there are any other options. I have also provided a temporary handicap placard form.

## 2023-11-18 NOTE — Assessment & Plan Note (Signed)

## 2023-11-18 NOTE — Assessment & Plan Note (Signed)
 Declines shingrix, educated on disease process and is aware if he changes his mind to notify office

## 2023-11-18 NOTE — Assessment & Plan Note (Signed)
 Will check vitamin D level and supplement as needed.    Also encouraged to spend 15 minutes in the sun daily.

## 2023-11-18 NOTE — Assessment & Plan Note (Signed)
 HgbA1c was slightly elevated at last visit. Will check today.

## 2023-11-18 NOTE — Patient Instructions (Signed)
 Health Maintenance  Topic Date Due   Zoster (Shingles) Vaccine (1 of 2) 02/17/2024*   Pap with HPV screening  03/13/2024   Flu Shot  03/18/2024   Mammogram  01/27/2025   DTaP/Tdap/Td vaccine (2 - Td or Tdap) 10/23/2029   Colon Cancer Screening  12/14/2032   Hepatitis C Screening  Completed   HIV Screening  Completed   HPV Vaccine  Aged Out   COVID-19 Vaccine  Discontinued  *Topic was postponed. The date shown is not the original due date.   Be sure to call Dr. Michaelle Copas office before September to remain an established patient.

## 2023-11-19 ENCOUNTER — Encounter: Payer: Self-pay | Admitting: Nurse Practitioner

## 2023-11-19 LAB — CBC WITH DIFFERENTIAL/PLATELET
Basophils Absolute: 0 10*3/uL (ref 0.0–0.2)
Basos: 1 %
EOS (ABSOLUTE): 0.2 10*3/uL (ref 0.0–0.4)
Eos: 3 %
Hematocrit: 44 % (ref 34.0–46.6)
Hemoglobin: 14.9 g/dL (ref 11.1–15.9)
Immature Grans (Abs): 0 10*3/uL (ref 0.0–0.1)
Immature Granulocytes: 1 %
Lymphocytes Absolute: 1.9 10*3/uL (ref 0.7–3.1)
Lymphs: 27 %
MCH: 31 pg (ref 26.6–33.0)
MCHC: 33.9 g/dL (ref 31.5–35.7)
MCV: 92 fL (ref 79–97)
Monocytes Absolute: 0.4 10*3/uL (ref 0.1–0.9)
Monocytes: 6 %
Neutrophils Absolute: 4.5 10*3/uL (ref 1.4–7.0)
Neutrophils: 62 %
Platelets: 301 10*3/uL (ref 150–450)
RBC: 4.8 x10E6/uL (ref 3.77–5.28)
RDW: 12.7 % (ref 11.7–15.4)
WBC: 7 10*3/uL (ref 3.4–10.8)

## 2023-11-19 LAB — CMP14+EGFR
ALT: 17 IU/L (ref 0–32)
AST: 19 IU/L (ref 0–40)
Albumin: 4 g/dL (ref 3.9–4.9)
Alkaline Phosphatase: 53 IU/L (ref 44–121)
BUN/Creatinine Ratio: 11 — ABNORMAL LOW (ref 12–28)
BUN: 10 mg/dL (ref 8–27)
Bilirubin Total: 0.7 mg/dL (ref 0.0–1.2)
CO2: 25 mmol/L (ref 20–29)
Calcium: 9.4 mg/dL (ref 8.7–10.3)
Chloride: 105 mmol/L (ref 96–106)
Creatinine, Ser: 0.88 mg/dL (ref 0.57–1.00)
Globulin, Total: 1.9 g/dL (ref 1.5–4.5)
Glucose: 92 mg/dL (ref 70–99)
Potassium: 4.5 mmol/L (ref 3.5–5.2)
Sodium: 141 mmol/L (ref 134–144)
Total Protein: 5.9 g/dL — ABNORMAL LOW (ref 6.0–8.5)
eGFR: 75 mL/min/{1.73_m2} (ref >59)

## 2023-11-19 LAB — LIPID PANEL
Chol/HDL Ratio: 3.6 ratio (ref 0.0–4.4)
Cholesterol, Total: 196 mg/dL (ref 100–199)
HDL: 54 mg/dL (ref >39)
LDL Chol Calc (NIH): 124 mg/dL — ABNORMAL HIGH (ref 0–99)
Triglycerides: 102 mg/dL (ref 0–149)
VLDL Cholesterol Cal: 18 mg/dL (ref 5–40)

## 2023-11-19 LAB — VITAMIN D 25 HYDROXY (VIT D DEFICIENCY, FRACTURES): Vit D, 25-Hydroxy: 42.5 ng/mL (ref 30.0–100.0)

## 2023-11-19 LAB — HEMOGLOBIN A1C
Est. average glucose Bld gHb Est-mCnc: 114 mg/dL
Hgb A1c MFr Bld: 5.6 % (ref 4.8–5.6)

## 2023-11-19 LAB — URIC ACID: Uric Acid: 2.6 mg/dL — ABNORMAL LOW (ref 3.0–7.2)

## 2023-11-19 LAB — SEDIMENTATION RATE: Sed Rate: 2 mm/h (ref 0–40)

## 2024-01-05 ENCOUNTER — Other Ambulatory Visit: Payer: Self-pay | Admitting: Nurse Practitioner

## 2024-01-05 ENCOUNTER — Other Ambulatory Visit (HOSPITAL_COMMUNITY): Payer: Self-pay

## 2024-01-05 DIAGNOSIS — M1A4620 Other secondary chronic gout, left knee, without tophus (tophi): Secondary | ICD-10-CM

## 2024-01-05 MED ORDER — ALLOPURINOL 300 MG PO TABS
300.0000 mg | ORAL_TABLET | Freq: Every day | ORAL | 1 refills | Status: DC
  Filled 2024-01-05: qty 90, 90d supply, fill #0
  Filled 2024-04-12: qty 90, 90d supply, fill #1

## 2024-02-02 ENCOUNTER — Other Ambulatory Visit: Payer: Self-pay | Admitting: Nurse Practitioner

## 2024-02-02 DIAGNOSIS — Z1231 Encounter for screening mammogram for malignant neoplasm of breast: Secondary | ICD-10-CM

## 2024-02-17 ENCOUNTER — Other Ambulatory Visit (HOSPITAL_COMMUNITY): Payer: Self-pay

## 2024-02-18 ENCOUNTER — Other Ambulatory Visit (HOSPITAL_COMMUNITY): Payer: Self-pay

## 2024-03-01 ENCOUNTER — Ambulatory Visit
Admission: RE | Admit: 2024-03-01 | Discharge: 2024-03-01 | Disposition: A | Discharge status: Home / Self Care | Source: Ambulatory Visit | Attending: Nurse Practitioner | Admitting: Nurse Practitioner

## 2024-03-01 DIAGNOSIS — Z1231 Encounter for screening mammogram for malignant neoplasm of breast: Secondary | ICD-10-CM

## 2024-03-09 NOTE — Progress Notes (Unsigned)
 [facility] [address] [place] [address] Phone: [phone] Subjective:   ISusannah Owen, am serving as a scribe for Dr. Arthea Claudene.  I'm seeing this patient by the request  of:  Georgina Speaks, FNP  CC: bilateral foot pain   YEP:Dlagzrupcz  JONAI Jade Owen is a 63 y.o. female coming in with complaint of B foot pain. Last seen in 2021. Patient states swelling on the lateral side and pain around achilles tendon. Feels it mostly in the morning. L knee pain as well. Hasn't had gout flare.       Past Medical History:  Diagnosis Date   Acid reflux    Chest pain of uncertain etiology 02/27/2021   Pure hypercholesterolemia 09/20/2018   No past surgical history on file. Social History   Socioeconomic History   Marital status: Single    Spouse name: Not on file   Number of children: Not on file   Years of education: Not on file   Highest education level: Not on file  Occupational History   Not on file  Tobacco Use   Smoking status: Never   Smokeless tobacco: Never  Substance and Sexual Activity   Alcohol use: No   Drug use: No   Sexual activity: Yes    Birth control/protection: I.U.D.  Other Topics Concern   Not on file  Social History Narrative   Not on file   Social Drivers of Health   Financial Resource Strain: Not on file  Food Insecurity: Not on file  Transportation Needs: Not on file  Physical Activity: Not on file  Stress: Not on file  Social Connections: Not on file   No Known Allergies Family History  Problem Relation Age of Onset   Breast cancer Mother    Heart failure Father    Heart disease Sister    Heart attack Maternal Aunt    Heart attack Paternal Uncle    Heart attack Cousin     Current Outpatient Medications (Endocrine & Metabolic):    predniSONE  (STERAPRED UNI-PAK 21 TAB) 5 MG (21) TBPK tablet, Take as directed on foil pack  Current Outpatient Medications (Cardiovascular):    atorvastatin   (LIPITOR) 10 MG tablet, Take 1 tablet (10 mg total) by mouth daily.   metoprolol  tartrate (LOPRESSOR ) 50 MG tablet, Take 1 tablet (50 mg total) by mouth 2 (two) times daily. PATIENT NEEDS APPOINTMENT   Current Outpatient Medications (Analgesics):    allopurinol  (ZYLOPRIM ) 300 MG tablet, Take 1 tablet (300 mg total) by mouth daily.   colchicine  0.6 MG tablet, Take 1 tablet (0.6 mg total) by mouth daily.   nabumetone  (RELAFEN ) 500 MG tablet, Take 1 tablet (500 mg total) by mouth daily.   Current Outpatient Medications (Other):    ondansetron  (ZOFRAN -ODT) 8 MG disintegrating tablet, Dissolve 1 tablet (8 mg total) by mouth every 8 (eight) hours as needed for nausea or vomiting.   pantoprazole  (PROTONIX ) 40 MG tablet, Take 1 tablet (40 mg total) by mouth daily.   polyethylene glycol (GOLYTELY ) 236 g solution, Take as directed per package instructions.   Reviewed prior external information including notes and imaging from  primary care provider As well as notes that were available from care everywhere and other healthcare systems.  Past medical history, social, surgical and family history all reviewed in electronic medical record.  No pertanent information unless stated regarding to the chief complaint.   Review of Systems:  No headache, visual changes, nausea, vomiting, diarrhea, constipation,  dizziness, abdominal pain, skin rash, fevers, chills, night sweats, weight loss, swollen lymph nodes, body aches, joint swelling, chest pain, shortness of breath, mood changes. POSITIVE muscle aches  Objective  Blood pressure 126/82, pulse 88, height 5' 2 (1.575 m), weight 160 lb (72.6 kg), SpO2 98%.   General: No apparent distress alert and oriented x3 mood and affect normal, dressed appropriately.  HEENT: Pupils equal, extraocular movements intact  Respiratory: Patient's speak in full sentences and does not appear short of breath  Cardiovascular: No lower extremity edema, non tender, no erythema   Bilateral foot exam shows significant pes planus noted bilaterally.  Patient does have tightness of the posterior capsule noted.  Neurovascularly intact distally.  Rigid midfoot noted. There is no.  Positive grind test noted.  Lateral tracking noted of the kneecap.  No significant effusion noted.  Limited muscular skeletal ultrasound was performed and interpreted by CLAUDENE HUSSAR, M  Limited ultrasound does not show any significant hypoechoic changes of the Achilles.  Still some calcific changes noted to be under part or could be potentially gouty deposits.  Difficult to assess completely.  No underlying arthritic changes noted.     Impression and Recommendations:    The above documentation has been reviewed and is accurate and complete Crystian Frith M Jayle Solarz, DO

## 2024-03-10 ENCOUNTER — Encounter: Payer: Self-pay | Admitting: Family Medicine

## 2024-03-10 ENCOUNTER — Other Ambulatory Visit: Payer: Self-pay

## 2024-03-10 ENCOUNTER — Ambulatory Visit: Admitting: Family Medicine

## 2024-03-10 VITALS — BP 126/82 | HR 88 | Ht 62.0 in | Wt 160.0 lb

## 2024-03-10 DIAGNOSIS — M1A4620 Other secondary chronic gout, left knee, without tophus (tophi): Secondary | ICD-10-CM

## 2024-03-10 DIAGNOSIS — G8929 Other chronic pain: Secondary | ICD-10-CM

## 2024-03-10 DIAGNOSIS — M25562 Pain in left knee: Secondary | ICD-10-CM

## 2024-03-10 DIAGNOSIS — M6788 Other specified disorders of synovium and tendon, other site: Secondary | ICD-10-CM | POA: Diagnosis not present

## 2024-03-10 NOTE — Assessment & Plan Note (Signed)
 Degenerative knee.  Arthritic changes tricompartmental but seems to be worsening of the patella.  New Tru pull lite brace given secondary to some of the instability today.  Follow-up again in 6 to 8 weeks.

## 2024-03-10 NOTE — Assessment & Plan Note (Signed)
 I feel like patient is doing better overall.  In the last year uric acid's have been actually on the lower side.  Will discontinue the colchicine , continue the allopurinol , discussed over-the-counter medications.  Increase activity slowly.  Discussed icing regimen.  Discussed diets.  Follow-up again in 2 to 3 months

## 2024-03-10 NOTE — Patient Instructions (Addendum)
 3600mg  Tart Cherry Do prescribed exercises at least 3x a week Oofos recovery sandals in the house Can stop colchicine  for now Recheck uric acid in 3 months You have 14 days to return or exchange your brace Call [phone], then return the brace to our office See you again in 3 months

## 2024-04-21 ENCOUNTER — Encounter: Payer: Self-pay | Admitting: *Deleted

## 2024-04-25 ENCOUNTER — Encounter (HOSPITAL_BASED_OUTPATIENT_CLINIC_OR_DEPARTMENT_OTHER): Payer: Self-pay | Admitting: Cardiovascular Disease

## 2024-04-25 ENCOUNTER — Other Ambulatory Visit (HOSPITAL_COMMUNITY): Payer: Self-pay

## 2024-04-25 ENCOUNTER — Ambulatory Visit (INDEPENDENT_AMBULATORY_CARE_PROVIDER_SITE_OTHER): Admitting: Cardiovascular Disease

## 2024-04-25 VITALS — BP 130/70 | HR 72 | Ht 62.0 in | Wt 162.0 lb

## 2024-04-25 DIAGNOSIS — R002 Palpitations: Secondary | ICD-10-CM

## 2024-04-25 DIAGNOSIS — E7800 Pure hypercholesterolemia, unspecified: Secondary | ICD-10-CM

## 2024-04-25 MED ORDER — METOPROLOL TARTRATE 50 MG PO TABS
50.0000 mg | ORAL_TABLET | Freq: Two times a day (BID) | ORAL | 0 refills | Status: DC
  Filled 2024-04-25: qty 60, 30d supply, fill #0

## 2024-04-25 NOTE — Progress Notes (Signed)
 Cardiology Office Note:  .   Date:  04/25/2024  ID:  Jade Owen, DOB 1961/04/05, MRN [id number] PCP: Georgina Speaks, FNP  [facility] Providers Cardiologist:  Annabella Scarce, MD    History of Present Illness: Jade Owen    Jade Owen is a 63 y.o. female with a hx of palpitations, acid reflux, and hyperlipidemia here for follow-up.  She was last seen in 2022.  She was evaluated for palpitations that occurred in the setting of not taking metoprolol  regularly.  She had seen cardiology remotely and her PCP referred her back to cardiology.  She also has a family history of CAD.  She was referred for a ZIO monitor which revealed 1 episode of Mobitz 1 second-degree heart block rare PVCs, and 4 beats of SVT.  Metoprolol  was increased to 50 mg twice daily but when she followed up with Jade Owen 04/2021 she was only taking it once daily.  She was also referred for cardiac CT but was unable to get it due to cost.  It was recommended that she get a calcium  score but never had it done.  Discussed the use of AI scribe software for clinical note transcription with the patient, who gave verbal consent to proceed.  History of Present Illness Jade Owen is a 63 year old female who presents for evaluation of her cardiovascular health. She was referred by her primary care physician due to her family history of heart disease.  She experiences occasional acid reflux and heartburn with chest pains, though these are less frequent than before. She takes medication as needed for these symptoms, which she finds helpful.  She experiences persistent fatigue, which she attributes to working from home and reduced physical activity. She feels tired upon waking, despite sleeping well at night, except for waking to urinate. She does not believe she snores and lives alone, so she has no confirmation of sleep apnea symptoms.  She is trying to exercise more and has purchased a walking pad, though she has only used  it once. She used to be very active but finds it challenging to maintain a routine now.  Her diet has been healthier, with a focus on [place] and reduced fried foods, though she admits to eating some junk food. She has lost weight but is concerned about losing too much as she dislikes how her clothes fit. She has stopped eating bread and reduced portion sizes, which she believes contributed to her weight loss.  She is on atorvastatin  for cholesterol management. She has not pursued a calcium  score test yet, despite previous discussions about it due to her family history of heart disease.  ROS:  As per HPI  Studies Reviewed: Jade Owen   EKG Interpretation Date/Time:  Monday April 25 2024 09:34:57 EDT Ventricular Rate:  72 PR Interval:  172 QRS Duration:  80 QT Interval:  382 QTC Calculation: 418 R Axis:   59  Text Interpretation: Normal sinus rhythm Normal ECG No previous ECGs available Confirmed by Scarce Annabella ([id number]) on 04/25/2024 9:43:49 AM   Calcium  score pending  Risk Assessment/Calculations:        Physical Exam:   VS:  BP 130/70   Pulse 72   Ht 5' 2 (1.575 m)   Wt 162 lb (73.5 kg)   BMI 29.63 kg/m  , BMI Body mass index is 29.63 kg/m. GENERAL:  Well appearing HEENT: Pupils equal round and reactive, fundi not visualized, oral mucosa unremarkable NECK:  No jugular venous distention, waveform  within normal limits, carotid upstroke brisk and symmetric, no bruits, no thyromegaly LUNGS:  Clear to auscultation bilaterally HEART:  RRR.  PMI not displaced or sustained,S1 and S2 within normal limits, no S3, no S4, no clicks, no rubs, no murmurs ABD:  Flat, positive bowel sounds normal in frequency in pitch, no bruits, no rebound, no guarding, no midline pulsatile mass, no hepatomegaly, no splenomegaly EXT:  2 plus pulses throughout, no edema, no cyanosis no clubbing SKIN:  No rashes no nodules NEURO:  Cranial nerves II through XII grossly intact, motor grossly intact  throughout PSYCH:  Cognitively intact, oriented to person place and time   ASSESSMENT AND PLAN: .    Assessment & Plan # Hypercholesterolemia: # Family history of CAD: Cholesterol well-managed with atorvastatin . LDL at 124. Discussed calcium  score for plaque assessment and potential aggressive treatment if needed. - Encourage calcium  score for cardiovascular risk assessment. - Continue atorvastatin .  LDL goal to be guided by calcium  score.  - Focus on diet and exercise.  # Overweight:  BMI 29. Discussed weight loss importance for cardiovascular health due to family history of heart disease. Ideal weight around 140 lbs. - Encourage healthy diet and exercise for weight loss. - Provide dietary guidance on lean proteins, fruits, vegetables, and complex carbohydrates. - Advise reducing portion sizes and limiting starchy foods.  # Gastroesophageal reflux disease (GERD) Intermittent chest pain likely GERD-related. Discussed abdominal weight's impact on acid reflux. - Continue Topo as needed. - Encourage further weight loss. - Provide dietary advice to reduce GERD, including reducing carbohydrate intake.  # Fatigue:  Reports fatigue despite adequate sleep. Discussed potential causes including sleep apnea and poor sleep hygiene. Consider home sleep study if needed. - Encourage consistent sleep routine, aiming for 8 hours per night. - Consider home sleep study if fatigue persists.      Dispo: f/u as needed  Signed, Annabella Scarce, MD

## 2024-04-25 NOTE — Patient Instructions (Addendum)
 Medication Instructions:  YOUR METOPROLOL  HAS  BEEN REFILLED FOR 1 YEAR. DISCUSS WITH YOUR PRIMARY CARE DOCTOR ABOUT REFILLING AFTER THAT POINT.   Labwork: NONE  Testing/Procedures: CALCIUM  SCORE -IF YOU DECIDE TO GET THIS IT WILL COST YOU $99 OUT OF POCKET   Follow-Up: AS NEEDED

## 2024-05-30 ENCOUNTER — Ambulatory Visit: Admitting: Nurse Practitioner

## 2024-05-30 ENCOUNTER — Encounter: Payer: Self-pay | Admitting: Nurse Practitioner

## 2024-05-30 ENCOUNTER — Other Ambulatory Visit (HOSPITAL_COMMUNITY): Payer: Self-pay

## 2024-05-30 VITALS — BP 120/78 | HR 82 | Temp 98.7°F | Ht 62.0 in | Wt 164.2 lb

## 2024-05-30 DIAGNOSIS — Z6830 Body mass index (BMI) 30.0-30.9, adult: Secondary | ICD-10-CM

## 2024-05-30 DIAGNOSIS — K219 Gastro-esophageal reflux disease without esophagitis: Secondary | ICD-10-CM | POA: Diagnosis not present

## 2024-05-30 DIAGNOSIS — E66811 Obesity, class 1: Secondary | ICD-10-CM | POA: Diagnosis not present

## 2024-05-30 DIAGNOSIS — R7309 Other abnormal glucose: Secondary | ICD-10-CM

## 2024-05-30 DIAGNOSIS — E6609 Other obesity due to excess calories: Secondary | ICD-10-CM | POA: Diagnosis not present

## 2024-05-30 DIAGNOSIS — R110 Nausea: Secondary | ICD-10-CM

## 2024-05-30 DIAGNOSIS — Z2821 Immunization not carried out because of patient refusal: Secondary | ICD-10-CM

## 2024-05-30 DIAGNOSIS — K5904 Chronic idiopathic constipation: Secondary | ICD-10-CM

## 2024-05-30 DIAGNOSIS — E782 Mixed hyperlipidemia: Secondary | ICD-10-CM

## 2024-05-30 DIAGNOSIS — R1900 Intra-abdominal and pelvic swelling, mass and lump, unspecified site: Secondary | ICD-10-CM | POA: Diagnosis not present

## 2024-05-30 LAB — HEMOGLOBIN A1C
Est. average glucose Bld gHb Est-mCnc: 120 mg/dL
Hgb A1c MFr Bld: 5.8 % — ABNORMAL HIGH (ref 4.8–5.6)

## 2024-05-30 LAB — LIPID PANEL
Chol/HDL Ratio: 3.1 ratio (ref 0.0–4.4)
Cholesterol, Total: 174 mg/dL (ref 100–199)
HDL: 56 mg/dL (ref >39)
LDL Chol Calc (NIH): 104 mg/dL — ABNORMAL HIGH (ref 0–99)
Triglycerides: 76 mg/dL (ref 0–149)
VLDL Cholesterol Cal: 14 mg/dL (ref 5–40)

## 2024-05-30 MED ORDER — ONDANSETRON 8 MG PO TBDP
8.0000 mg | ORAL_TABLET | Freq: Three times a day (TID) | ORAL | 0 refills | Status: AC | PRN
  Filled 2024-05-30: qty 30, 10d supply, fill #0

## 2024-05-30 MED ORDER — ATORVASTATIN CALCIUM 10 MG PO TABS
10.0000 mg | ORAL_TABLET | Freq: Every day | ORAL | 1 refills | Status: AC
  Filled 2024-05-30: qty 90, 90d supply, fill #0
  Filled 2024-09-01: qty 90, 90d supply, fill #1

## 2024-05-30 NOTE — Progress Notes (Signed)
 LILLETTE Kristeen [name], CMA,acting as a Neurosurgeon for Gaines Ada, FNP.,have documented all relevant documentation on the behalf of Gaines Ada, FNP,as directed by  Gaines Ada, FNP while in the presence of Gaines Ada, FNP.  Subjective:  Patient ID: Jade Owen , female    DOB: 02-05-1961 , 63 y.o.   MRN: [id number]  Chief Complaint  Patient presents with   Hyperlipidemia    Patient presents today for a chol and pre dm follow up, Patient reports compliance with medication. Patient denies any chest pain, SOB, or headaches. Patient has no concerns today.    Hernia    Patient reports she has a hernia in her stomach. 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 She reports hitting it over the weekend and it was very sore.      HPI  Discussed the use of AI scribe software for clinical note transcription with the patient, who gave verbal consent to proceed.  History of Present Illness Jade Owen is a 63 year old female with a history of hiatal hernia and acid reflux who presents for follow-up and medication refills.  She has ongoing issues with a hiatal hernia, initially diagnosed in 2001 via a barium esophagram. She describes her stomach as 'lopsided' and notes a hard, painful knot near her navel, which has been increasingly bothersome. There is soreness and pain upon touch, and the patient feels that the hernia has likely grown over the years. She mentions difficulty with bowel movements, requiring straining, and occasional blood in her stool when straining.  She experiences nausea, which she attributes to her medication, specifically colchicine , which she has stopped taking due to nausea. She continues to take allopurinol  and uses Zofran  for nausea management. She also reports episodes of severe acid reflux, describing it as 'coming up' and causing significant discomfort.  She has a history of constipation and has tried various remedies, including Miralax  and a product called colon cleanse, but finds them ineffective. She drinks water regularly but acknowledges a lack of fiber in her diet.  She works part-time at [employer], which involves physical tasks that exacerbate her hernia symptoms. 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  She reports a busy work schedule, which has delayed her ability to undergo a calcium  score test ordered by her cardiologist. She mentions seeing her sports medicine doctor for knee and foot issues, who has temporarily discontinued colchicine  due to nausea.   Past Medical History:  Diagnosis Date   Acid reflux    Chest pain of uncertain etiology 02/27/2021   Pure hypercholesterolemia 09/20/2018     Family History  Problem Relation Age of Onset   Breast cancer Mother    Heart failure Father    Heart disease Sister    Heart attack Maternal Aunt    Heart attack Paternal Uncle    Heart attack Cousin      Current Outpatient Medications:    allopurinol  (ZYLOPRIM ) 300 MG tablet, Take 1 tablet (300 mg total) by mouth daily., Disp: 90 tablet, Rfl: 1   metoprolol  tartrate (LOPRESSOR ) 50 MG tablet, Take 1 tablet (50 mg total) by mouth 2 (two) times daily., Disp: 60 tablet, Rfl: 0   pantoprazole  (PROTONIX ) 40 MG tablet, Take 1 tablet (40 mg total) by mouth daily., Disp: 90 tablet, Rfl: 1   polyethylene glycol (GOLYTELY ) 236 g solution, Take as directed per package  instructions., Disp: 4000 mL, Rfl: 0   atorvastatin  (LIPITOR) 10 MG tablet, Take 1 tablet (10 mg total) by mouth daily., Disp: 90 tablet, Rfl: 1   colchicine  0.6 MG tablet, Take 1 tablet (0.6 mg total) by mouth daily. (Patient not taking: Reported on 05/30/2024), Disp: 90 tablet, Rfl: 1   nabumetone  (RELAFEN ) 500 MG tablet, Take 1 tablet (500 mg total) by mouth daily. (Patient not taking: Reported on 05/30/2024), Disp: 30 tablet, Rfl: 2   ondansetron  (ZOFRAN -ODT) 8 MG  disintegrating tablet, Dissolve 1 tablet (8 mg total) by mouth every 8 (eight) hours as needed for nausea or vomiting., Disp: 30 tablet, Rfl: 0   predniSONE  (STERAPRED UNI-PAK 21 TAB) 5 MG (21) TBPK tablet, Take as directed on foil pack (Patient not taking: Reported on 05/30/2024), Disp: 21 tablet, Rfl: 0   No Known Allergies   Review of Systems  Constitutional: Negative.   Respiratory: Negative.    Cardiovascular: Negative.   Gastrointestinal:  Positive for abdominal pain (abdomen near umbilicus), constipation and nausea. Negative for rectal pain and vomiting.  Neurological: Negative.   Psychiatric/Behavioral: Negative.       Today's Vitals   05/30/24 0830  BP: 120/78  Pulse: 82  Temp: 98.7 F (37.1 C)  TempSrc: Oral  Weight: 164 lb 3.2 oz (74.5 kg)  Height: 5' 2 (1.575 m)  PainSc: 4   PainLoc: Knee   Body mass index is 30.03 kg/m.  Wt Readings from Last 3 Encounters:  05/30/24 164 lb 3.2 oz (74.5 kg)  04/25/24 162 lb (73.5 kg)  03/10/24 160 lb (72.6 kg)      Objective:  Physical Exam Vitals and nursing note reviewed.  Constitutional:      General: She is not in acute distress.    Appearance: Normal appearance.  Cardiovascular:     Rate and Rhythm: Normal rate and regular rhythm.     Pulses: Normal pulses.     Heart sounds: Normal heart sounds. No murmur heard. Pulmonary:     Effort: Pulmonary effort is normal. No respiratory distress.     Breath sounds: Normal breath sounds. No wheezing.  Abdominal:     General: Abdomen is flat. Bowel sounds are normal. There is no distension.     Palpations: Abdomen is soft. There is mass (semi soft mass palpable at umbilicus).     Tenderness: There is abdominal tenderness (umbilicus and right mid abdomen).     Hernia: A hernia is present.  Skin:    General: Skin is warm and dry.     Capillary Refill: Capillary refill takes less than 2 seconds.  Neurological:     General: No focal deficit present.     Mental Status: She  is alert and oriented to person, place, and time.     Cranial Nerves: No cranial nerve deficit.     Motor: No weakness.  Psychiatric:        Mood and Affect: Mood normal.        Behavior: Behavior normal.        Thought Content: Thought content normal.        Judgment: Judgment normal.     Assessment And Plan:  Mixed hyperlipidemia Assessment & Plan: On atorvastatin  for mixed hyperlipidemia. No muscle aches or cramps reported. - Refill atorvastatin  10 MG oral daily.  Orders: -     Lipid panel -     Atorvastatin  Calcium ; Take 1 tablet (10 mg total) by mouth daily.  Dispense: 90 tablet; Refill: 1  Abnormal glucose Assessment & Plan: HgbA1c was slightly elevated at last visit. Will check today.    Orders: -     Hemoglobin A1c  Nausea -     Ondansetron ; Dissolve 1 tablet (8 mg total) by mouth every 8 (eight) hours as needed for nausea or vomiting.  Dispense: 30 tablet; Refill: 0 -     DG Abd 1 View; Future  Herpes zoster vaccination declined  Class 1 obesity due to excess calories with body mass index (BMI) of 30.0 to 30.9 in adult, unspecified whether serious comorbidity present Assessment & Plan: Weight gain around abdomen possibly due to sedentary lifestyle and dietary habits. Difficulty losing weight and concerns about muscle mass loss with exercise. - Discuss dietary modifications, including reducing breads and pastas. - Encourage increased physical activity as tolerated.   Gastroesophageal reflux disease without esophagitis  Abdominal wall bulge Assessment & Plan: Hard, painful mass near umbilicus suggests abdominal wall hernia. Constipation and abdominal pain likely related to hernia. Hernia growth over years causing discomfort and functional limitations. Discussed rupture risk and emergency surgery. - Order KUB X-ray to assess constipation and hernia status. - Order CT scan to evaluate hernia and abdominal structures. - Provide work accommodation note to avoid  activities exacerbating hernia symptoms. - Discuss potential surgical intervention if hernia confirmed and symptomatic.  Orders: -     CT ABDOMEN PELVIS WO CONTRAST; Future -     DG Abd 1 View; Future  Chronic idiopathic constipation Assessment & Plan: Nausea possibly related to constipation and hernia. Ondansetron  used as needed. Acid reflux may exacerbate nausea. Discussed constipation and potential obstruction as causes. - Continue Ondansetron  8 MG oral every 8 hours as needed. - Evaluate for obstruction or other causes of nausea with imaging studies.  Orders: -     DG Abd 1 View; Future     Return for keep same next.  Patient was given opportunity to ask questions. Patient verbalized understanding of the plan and was able to repeat key elements of the plan. All questions were answered to their satisfaction.    LILLETTE Gaines Ada, FNP, have reviewed all documentation for this visit. The documentation on 05/30/24 for the exam, diagnosis, procedures, and orders are all accurate and complete.   IF YOU HAVE BEEN REFERRED TO A SPECIALIST, IT MAY TAKE 1-2 WEEKS TO SCHEDULE/PROCESS THE REFERRAL. IF YOU HAVE NOT HEARD FROM US /SPECIALIST IN TWO WEEKS, PLEASE GIVE US  A CALL AT [phone] X [phone].

## 2024-06-03 ENCOUNTER — Other Ambulatory Visit

## 2024-06-05 ENCOUNTER — Ambulatory Visit: Payer: Self-pay | Admitting: Nurse Practitioner

## 2024-06-05 DIAGNOSIS — R110 Nausea: Secondary | ICD-10-CM | POA: Insufficient documentation

## 2024-06-05 DIAGNOSIS — K5904 Chronic idiopathic constipation: Secondary | ICD-10-CM | POA: Insufficient documentation

## 2024-06-05 DIAGNOSIS — E6609 Other obesity due to excess calories: Secondary | ICD-10-CM | POA: Insufficient documentation

## 2024-06-05 DIAGNOSIS — R1900 Intra-abdominal and pelvic swelling, mass and lump, unspecified site: Secondary | ICD-10-CM | POA: Insufficient documentation

## 2024-06-05 NOTE — Assessment & Plan Note (Signed)
 HgbA1c was slightly elevated at last visit. Will check today.

## 2024-06-05 NOTE — Assessment & Plan Note (Signed)
 Weight gain around abdomen possibly due to sedentary lifestyle and dietary habits. Difficulty losing weight and concerns about muscle mass loss with exercise. - Discuss dietary modifications, including reducing breads and pastas. - Encourage increased physical activity as tolerated.

## 2024-06-05 NOTE — Assessment & Plan Note (Signed)
 Nausea possibly related to constipation and hernia. Ondansetron  used as needed. Acid reflux may exacerbate nausea. Discussed constipation and potential obstruction as causes. - Continue Ondansetron  8 MG oral every 8 hours as needed. - Evaluate for obstruction or other causes of nausea with imaging studies.

## 2024-06-05 NOTE — Assessment & Plan Note (Signed)
 Hard, painful mass near umbilicus suggests abdominal wall hernia. Constipation and abdominal pain likely related to hernia. Hernia growth over years causing discomfort and functional limitations. Discussed rupture risk and emergency surgery. - Order KUB X-ray to assess constipation and hernia status. - Order CT scan to evaluate hernia and abdominal structures. - Provide work accommodation note to avoid activities exacerbating hernia symptoms. - Discuss potential surgical intervention if hernia confirmed and symptomatic.

## 2024-06-05 NOTE — Assessment & Plan Note (Signed)
 On atorvastatin  for mixed hyperlipidemia. No muscle aches or cramps reported. - Refill atorvastatin  10 MG oral daily.

## 2024-06-06 ENCOUNTER — Ambulatory Visit
Admission: RE | Admit: 2024-06-06 | Discharge: 2024-06-06 | Disposition: A | Discharge status: Home / Self Care | Source: Ambulatory Visit | Attending: Nurse Practitioner | Admitting: Nurse Practitioner

## 2024-06-06 DIAGNOSIS — K429 Umbilical hernia without obstruction or gangrene: Secondary | ICD-10-CM | POA: Diagnosis not present

## 2024-06-06 DIAGNOSIS — R1900 Intra-abdominal and pelvic swelling, mass and lump, unspecified site: Secondary | ICD-10-CM

## 2024-06-07 ENCOUNTER — Ambulatory Visit: Payer: Self-pay

## 2024-06-07 ENCOUNTER — Other Ambulatory Visit: Payer: Self-pay | Admitting: Nurse Practitioner

## 2024-06-07 DIAGNOSIS — K429 Umbilical hernia without obstruction or gangrene: Secondary | ICD-10-CM

## 2024-06-07 DIAGNOSIS — R935 Abnormal findings on diagnostic imaging of other abdominal regions, including retroperitoneum: Secondary | ICD-10-CM

## 2024-06-07 NOTE — Telephone Encounter (Signed)
 FYI Only or Action Required?: FYI only for provider.  Patient was last seen in primary care on 05/30/2024 by Georgina Speaks, FNP.  Called Nurse Triage reporting Critical Lab.  Symptoms began n/a.  Interventions attempted: Other: n/a.  Symptoms are: n/a.  Triage Disposition: Information or Advice Only Call  Patient/caregiver understands and will follow disposition?: Yes Reason for Disposition  [1] Caller is not with the adult (patient) AND [2] probable NON-URGENT symptoms  Answer Assessment - Initial Assessment Questions Critical Lab: CT Abdomen and Pelvis results of thickening at top of a appendix, appendacitis unlikely. Documentation in chart shows PCP already aware of results today, and informed patient / referring out to general surgery.   1. REASON FOR CALL: What is the main reason for your call? or How can I best help you?     [facility] calling to report to critical imaging result.  Protocols used: Information Only Call - No Triage-A-AH  Copied from CRM [id number]. Topic: Clinical - Lab/Test Results >> Jun 07, 2024  5:41 PM Tinnie BROCKS wrote: Reason for CRM: Stacy with [facility] calling to report stat results.

## 2024-06-15 NOTE — Progress Notes (Unsigned)
 [name] [address] [place] [address] Phone: [phone] Subjective:   Jade Owen, am serving as a scribe for Dr. Arthea Claudene.  I'm seeing this patient by the request  of:  Georgina Speaks, FNP  CC: Knee pain follow-up  YEP:Dlagzrupcz  03/10/2024 Degenerative knee.  Arthritic changes tricompartmental but seems to be worsening of the patella.  New Tru pull lite brace given secondary to some of the instability today.  Follow-up again in 6 to 8 weeks.     I feel like patient is doing better overall.  In the last year uric acid's have been actually on the lower side.  Will discontinue the colchicine , continue the allopurinol , discussed over-the-counter medications.  Increase activity slowly.  Discussed icing regimen.  Discussed diets.  Follow-up again in 2 to 3 months      Update 06/17/2024 Jade Owen is a 63 y.o. female coming in with complaint of achilles and L knee pain.  Was seen 3 months ago.  Patient states that her feet continue to bother her. She develops swelling in lateral malleolus.  L knee has been doing well since last visit. No gout flares since last visit. If she has to stand at colliseum she will develop a limp due to pain.      Past Medical History:  Diagnosis Date   Acid reflux    Chest pain of uncertain etiology 02/27/2021   Pure hypercholesterolemia 09/20/2018   No past surgical history on file. Social History   Socioeconomic History   Marital status: Single    Spouse name: Not on file   Number of children: Not on file   Years of education: Not on file   Highest education level: Not on file  Occupational History   Not on file  Tobacco Use   Smoking status: Never   Smokeless tobacco: Never  Substance and Sexual Activity   Alcohol use: No   Drug use: No   Sexual activity: Yes    Birth control/protection: I.U.D.  Other Topics Concern   Not on file  Social History Narrative   Not on file   Social  Drivers of Health   Financial Resource Strain: Not on file  Food Insecurity: Not on file  Transportation Needs: Not on file  Physical Activity: Not on file  Stress: Not on file  Social Connections: Not on file   No Known Allergies Family History  Problem Relation Age of Onset   Breast cancer Mother    Heart failure Father    Heart disease Sister    Heart attack Maternal Aunt    Heart attack Paternal Uncle    Heart attack Cousin     Current Outpatient Medications (Endocrine & Metabolic):    predniSONE  (STERAPRED UNI-PAK 21 TAB) 5 MG (21) TBPK tablet, Take as directed on foil pack (Patient not taking: Reported on 05/30/2024)  Current Outpatient Medications (Cardiovascular):    atorvastatin  (LIPITOR) 10 MG tablet, Take 1 tablet (10 mg total) by mouth daily.   metoprolol  tartrate (LOPRESSOR ) 50 MG tablet, Take 1 tablet (50 mg total) by mouth 2 (two) times daily.   Current Outpatient Medications (Analgesics):    allopurinol  (ZYLOPRIM ) 300 MG tablet, Take 1 tablet (300 mg total) by mouth daily.   colchicine  0.6 MG tablet, Take 1 tablet (0.6 mg total) by mouth daily. (Patient not taking: Reported on 05/30/2024)   nabumetone  (RELAFEN ) 500 MG tablet, Take 1 tablet (500 mg total) by mouth daily. (Patient  not taking: Reported on 05/30/2024)   Current Outpatient Medications (Other):    ondansetron  (ZOFRAN -ODT) 8 MG disintegrating tablet, Dissolve 1 tablet (8 mg total) by mouth every 8 (eight) hours as needed for nausea or vomiting.   pantoprazole  (PROTONIX ) 40 MG tablet, Take 1 tablet (40 mg total) by mouth daily.   polyethylene glycol (GOLYTELY ) 236 g solution, Take as directed per package instructions.   Reviewed prior external information including notes and imaging from  primary care provider As well as notes that were available from care everywhere and other healthcare systems.  Past medical history, social, surgical and family history all reviewed in electronic medical record.   No pertanent information unless stated regarding to the chief complaint.   Review of Systems:  No headache, visual changes, nausea, vomiting, diarrhea, constipation, dizziness, abdominal pain, skin rash, fevers, chills, night sweats, weight loss, swollen lymph nodes, body aches, joint swelling, chest pain, shortness of breath, mood changes. POSITIVE muscle aches  Objective  Pulse 78, height 5' 2 (1.575 m), weight 166 lb (75.3 kg), SpO2 98%.   General: No apparent distress alert and oriented x3 mood and affect normal, dressed appropriately.  HEENT: Pupils equal, extraocular movements intact  Respiratory: Patient's speak in full sentences and does not appear short of breath  Cardiovascular: No lower extremity edema, non tender, no erythema  Knee exam shows mild diffuse tenderness.  More instability of the lateral compartment with varus deformity. Patient's foot exam shows overpronation noted of the hindfoot.  Breakdown of the longitudinal arch noted as well.   Limited muscular skeletal ultrasound was performed and interpreted by CLAUDENE HUSSAR, M  Limited ultrasound of the patient's Achilles shows some dilatation of the tendon approximately 1 cm proximal to the insertion.  No tearing appreciated.  Mild increase in Doppler flow.   Impression and Recommendations:    The above documentation has been reviewed and is accurate and complete Thetis Schwimmer M Jule Whitsel, DO

## 2024-06-17 ENCOUNTER — Encounter: Payer: Self-pay | Admitting: Family Medicine

## 2024-06-17 ENCOUNTER — Ambulatory Visit: Admitting: Family Medicine

## 2024-06-17 ENCOUNTER — Other Ambulatory Visit: Payer: Self-pay

## 2024-06-17 VITALS — HR 78 | Ht 62.0 in | Wt 166.0 lb

## 2024-06-17 DIAGNOSIS — M79672 Pain in left foot: Secondary | ICD-10-CM

## 2024-06-17 DIAGNOSIS — G8929 Other chronic pain: Secondary | ICD-10-CM

## 2024-06-17 DIAGNOSIS — M216X1 Other acquired deformities of right foot: Secondary | ICD-10-CM

## 2024-06-17 DIAGNOSIS — M79671 Pain in right foot: Secondary | ICD-10-CM | POA: Diagnosis not present

## 2024-06-17 DIAGNOSIS — M25562 Pain in left knee: Secondary | ICD-10-CM | POA: Diagnosis not present

## 2024-06-17 DIAGNOSIS — M216X2 Other acquired deformities of left foot: Secondary | ICD-10-CM | POA: Diagnosis not present

## 2024-06-17 NOTE — Patient Instructions (Signed)
 Custom orthotics See me in 2-3 months

## 2024-06-17 NOTE — Assessment & Plan Note (Signed)
 Do believe that this could be potentially contributing to some of the aches and pains in the feet, ankles with patient's Achilles tendinosis as well as knee pain.  Would like patient to get custom orthotics at this point.  Referred accordingly.

## 2024-06-17 NOTE — Assessment & Plan Note (Signed)
 Continue to monitor the knee pain.  Lateral compartment arthritis.  A lateral compartment unloader brace given today.  Discussed continuing to monitor weight, increase core strengthening, hip abductor strengthening and VMO strengthening.  Wants to avoid any surgical intervention.  Can do injections if needed.  Follow-up again in 2 months

## 2024-06-22 ENCOUNTER — Encounter: Admitting: Internal Medicine

## 2024-07-13 ENCOUNTER — Other Ambulatory Visit: Payer: Self-pay

## 2024-07-13 ENCOUNTER — Other Ambulatory Visit (HOSPITAL_COMMUNITY): Payer: Self-pay

## 2024-07-13 ENCOUNTER — Ambulatory Visit

## 2024-07-13 ENCOUNTER — Other Ambulatory Visit: Payer: Self-pay | Admitting: Nurse Practitioner

## 2024-07-13 VITALS — BP 124/84 | Ht 62.0 in | Wt 162.0 lb

## 2024-07-13 DIAGNOSIS — M216X2 Other acquired deformities of left foot: Secondary | ICD-10-CM

## 2024-07-13 DIAGNOSIS — M1A4620 Other secondary chronic gout, left knee, without tophus (tophi): Secondary | ICD-10-CM

## 2024-07-13 DIAGNOSIS — M216X1 Other acquired deformities of right foot: Secondary | ICD-10-CM

## 2024-07-13 DIAGNOSIS — M2142 Flat foot [pes planus] (acquired), left foot: Secondary | ICD-10-CM | POA: Diagnosis not present

## 2024-07-13 DIAGNOSIS — M2141 Flat foot [pes planus] (acquired), right foot: Secondary | ICD-10-CM

## 2024-07-13 MED ORDER — ALLOPURINOL 300 MG PO TABS
300.0000 mg | ORAL_TABLET | Freq: Every day | ORAL | 1 refills | Status: AC
  Filled 2024-07-13: qty 90, 90d supply, fill #0
  Filled 2024-08-25: qty 90, 90d supply, fill #1

## 2024-07-13 NOTE — Progress Notes (Signed)
  PCP: Georgina Speaks, FNP  Subjective:   HPI: Patient is a 63 y.o. female here for custom orthotics.  She was referred by Dr. Claudene.  And has history there is noted to be bilateral pronation, as well as history of Achilles disease.  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  Past Medical History:  Diagnosis Date   Acid reflux    Chest pain of uncertain etiology 02/27/2021   Pure hypercholesterolemia 09/20/2018    Current Outpatient Medications on File Prior to Visit  Medication Sig Dispense Refill   atorvastatin  (LIPITOR) 10 MG tablet Take 1 tablet (10 mg total) by mouth daily. 90 tablet 1   colchicine  0.6 MG tablet Take 1 tablet (0.6 mg total) by mouth daily. (Patient not taking: Reported on 05/30/2024) 90 tablet 1   metoprolol  tartrate (LOPRESSOR ) 50 MG tablet Take 1 tablet (50 mg total) by mouth 2 (two) times daily. 60 tablet 0   nabumetone  (RELAFEN ) 500 MG tablet Take 1 tablet (500 mg total) by mouth daily. (Patient not taking: Reported on 05/30/2024) 30 tablet 2   ondansetron  (ZOFRAN -ODT) 8 MG disintegrating tablet Dissolve 1 tablet (8 mg total) by mouth every 8 (eight) hours as needed for nausea or vomiting. 30 tablet 0   pantoprazole  (PROTONIX ) 40 MG tablet Take 1 tablet (40 mg total) by mouth daily. 90 tablet 1   polyethylene glycol (GOLYTELY ) 236 g solution Take as directed per package instructions. 4000 mL 0   predniSONE  (STERAPRED UNI-PAK 21 TAB) 5 MG (21) TBPK tablet Take as directed on foil pack (Patient not taking: Reported on 05/30/2024) 21 tablet 0   [DISCONTINUED] dexlansoprazole  (DEXILANT ) 60 MG capsule Take 1 capsule (60 mg total) by mouth daily. 30 capsule 3   No current facility-administered medications on file prior to visit.    BP 124/84   Ht 5' 2 (1.575 m)   Wt 162 lb (73.5 kg)   BMI 29.63 kg/m        Objective:   Physical Exam:  Gen: NAD, comfortable in exam room Bilateral feet Inspection: Pes planus bilaterally,  mild hallux valgus on the left, bilateral forefoot abduction palpation: Tenderness palpation of the insertion of the Achilles on the calcaneus Gait: Patient walks with a slightly pronated gait  Assessment/Plan:   Jade Owen is a 63 y.o. female who was seen today for the following: 1. Pes planus of both feet (Primary) 2. Pronation deformity of both feet  Patient was fitted for a : Fit 'n Run semi-rigid orthotic  The orthotic was heated, placed on the orthotic stand. The patient was positioned in subtalar neutral position and 10 degrees of ankle dorsiflexion in a weight bearing stance on the heated orthotic blank After completion of molding Blank: Fit 'n Run - size 6 Posting: First ray post bilaterally  Base: none  Discussed that with patient's bilateral heel lifts, she can leave these unattached and experiment whether she needs them or not.  I recommended she continue using them if she had relief from her Achilles pain with him.  Patient's gait was reanalyzed, she noted great comfort with the insoles.  Her gait had improved with less pronation.  She can follow-up as needed.   Follow-up/Education:   Return if symptoms worsen or fail to improve.   May return sooner as needed and encouraged to call/e-mail for additional questions or  worsening symptoms in the interim.  Krystal Lowing, DO Sports Medicine Fellow 07/13/2024 12:21 PM

## 2024-07-13 NOTE — Patient Instructions (Addendum)
 [employer]  [employer] 1080 X

## 2024-08-25 ENCOUNTER — Other Ambulatory Visit (HOSPITAL_COMMUNITY): Payer: Self-pay

## 2024-08-25 ENCOUNTER — Other Ambulatory Visit: Payer: Self-pay

## 2024-08-25 ENCOUNTER — Other Ambulatory Visit: Payer: Self-pay | Admitting: Nurse Practitioner

## 2024-08-25 ENCOUNTER — Other Ambulatory Visit (HOSPITAL_BASED_OUTPATIENT_CLINIC_OR_DEPARTMENT_OTHER): Payer: Self-pay | Admitting: Cardiovascular Disease

## 2024-08-25 DIAGNOSIS — R002 Palpitations: Secondary | ICD-10-CM

## 2024-08-25 MED ORDER — METOPROLOL TARTRATE 50 MG PO TABS
50.0000 mg | ORAL_TABLET | Freq: Two times a day (BID) | ORAL | 0 refills | Status: AC
  Filled 2024-08-25: qty 60, 30d supply, fill #0

## 2024-08-25 MED ORDER — PANTOPRAZOLE SODIUM 40 MG PO TBEC
40.0000 mg | DELAYED_RELEASE_TABLET | Freq: Every day | ORAL | 1 refills | Status: AC
  Filled 2024-08-25: qty 90, 90d supply, fill #0

## 2024-08-26 ENCOUNTER — Other Ambulatory Visit (HOSPITAL_COMMUNITY): Payer: Self-pay

## 2024-09-02 ENCOUNTER — Other Ambulatory Visit (HOSPITAL_COMMUNITY): Payer: Self-pay

## 2024-09-08 NOTE — Progress Notes (Unsigned)
 " [name] [address] [place] [address] Phone: [phone] Subjective:    I'm seeing this patient by the request  of:  Georgina Speaks, FNP  CC:   YEP:Dlagzrupcz  06/17/2024 Do believe that this could be potentially contributing to some of the aches and pains in the feet, ankles with patient's Achilles tendinosis as well as knee pain.  Would like patient to get custom orthotics at this point.  Referred accordingly.     Continue to monitor the knee pain.  Lateral compartment arthritis.  A lateral compartment unloader brace given today.  Discussed continuing to monitor weight, increase core strengthening, hip abductor strengthening and VMO strengthening.  Wants to avoid any surgical intervention.  Can do injections if needed.  Follow-up again in 2 months      Update 09/12/2024 Jade Owen is a 64 y.o. female coming in with complaint of L knee and B foot pain. Patient was fitted for custom orthotics. Patient states       Past Medical History:  Diagnosis Date   Acid reflux    Chest pain of uncertain etiology 02/27/2021   Pure hypercholesterolemia 09/20/2018   No past surgical history on file. Social History   Socioeconomic History   Marital status: Single    Spouse name: Not on file   Number of children: Not on file   Years of education: Not on file   Highest education level: Not on file  Occupational History   Not on file  Tobacco Use   Smoking status: Never   Smokeless tobacco: Never  Substance and Sexual Activity   Alcohol use: No   Drug use: No   Sexual activity: Yes    Birth control/protection: I.U.D.  Other Topics Concern   Not on file  Social History Narrative   Not on file   Social Drivers of Health   Tobacco Use: Low Risk (06/17/2024)   Patient History    Smoking Tobacco Use: Never    Smokeless Tobacco Use: Never    Passive Exposure: Not on file  Financial Resource Strain: Not on file  Food Insecurity: Not on  file  Transportation Needs: Not on file  Physical Activity: Not on file  Stress: Not on file  Social Connections: Not on file  Depression (PHQ2-9): Low Risk (11/18/2023)   Depression (PHQ2-9)    PHQ-2 Score: 0  Alcohol Screen: Not on file  Housing: Not on file  Utilities: Not on file  Health Literacy: Not on file   Allergies[1] Family History  Problem Relation Age of Onset   Breast cancer Mother    Heart failure Father    Heart disease Sister    Heart attack Maternal Aunt    Heart attack Paternal Uncle    Heart attack Cousin     Current Outpatient Medications (Endocrine & Metabolic):    predniSONE  (STERAPRED UNI-PAK 21 TAB) 5 MG (21) TBPK tablet, Take as directed on foil pack (Patient not taking: Reported on 05/30/2024)  Current Outpatient Medications (Cardiovascular):    atorvastatin  (LIPITOR) 10 MG tablet, Take 1 tablet (10 mg total) by mouth daily.   metoprolol  tartrate (LOPRESSOR ) 50 MG tablet, Take 1 tablet (50 mg total) by mouth 2 (two) times daily.  Current Outpatient Medications (Analgesics):    allopurinol  (ZYLOPRIM ) 300 MG tablet, Take 1 tablet (300 mg total) by mouth daily.   colchicine  0.6 MG tablet, Take 1 tablet (0.6 mg total) by mouth daily. (Patient not taking: Reported on 05/30/2024)  nabumetone  (RELAFEN ) 500 MG tablet, Take 1 tablet (500 mg total) by mouth daily. (Patient not taking: Reported on 05/30/2024)  Current Outpatient Medications (Other):    ondansetron  (ZOFRAN -ODT) 8 MG disintegrating tablet, Dissolve 1 tablet (8 mg total) by mouth every 8 (eight) hours as needed for nausea or vomiting.   pantoprazole  (PROTONIX ) 40 MG tablet, Take 1 tablet (40 mg total) by mouth daily.   polyethylene glycol (GOLYTELY ) 236 g solution, Take as directed per package instructions.   Reviewed prior external information including notes and imaging from  primary care provider As well as notes that were available from care everywhere and other healthcare systems.  Past  medical history, social, surgical and family history all reviewed in electronic medical record.  No pertanent information unless stated regarding to the chief complaint.   Review of Systems:  No headache, visual changes, nausea, vomiting, diarrhea, constipation, dizziness, abdominal pain, skin rash, fevers, chills, night sweats, weight loss, swollen lymph nodes, body aches, joint swelling, chest pain, shortness of breath, mood changes. POSITIVE muscle aches  Objective  There were no vitals taken for this visit.   General: No apparent distress alert and oriented x3 mood and affect normal, dressed appropriately.  HEENT: Pupils equal, extraocular movements intact  Respiratory: Patient's speak in full sentences and does not appear short of breath  Cardiovascular: No lower extremity edema, non tender, no erythema      Impression and Recommendations:           [1] No Known Allergies  "

## 2024-09-12 ENCOUNTER — Ambulatory Visit: Admitting: Family Medicine

## 2024-11-04 ENCOUNTER — Ambulatory Visit: Admitting: Family Medicine

## 2024-11-22 ENCOUNTER — Encounter: Admitting: Nurse Practitioner

## 2024-11-28 ENCOUNTER — Encounter: Payer: Self-pay | Admitting: Nurse Practitioner
